# Patient Record
Sex: Female | Born: 1960
Health system: Southern US, Community
[De-identification: ages and names within clinical notes are randomized; demographics above are authoritative.]

## PROBLEM LIST (undated history)

## (undated) DIAGNOSIS — I10 Essential (primary) hypertension: Secondary | ICD-10-CM

## (undated) DIAGNOSIS — E663 Overweight: Secondary | ICD-10-CM

## (undated) DIAGNOSIS — Z0000 Encounter for general adult medical examination without abnormal findings: Secondary | ICD-10-CM

## (undated) DIAGNOSIS — E119 Type 2 diabetes mellitus without complications: Secondary | ICD-10-CM

## (undated) HISTORY — DX: Type 2 diabetes mellitus without complications: E11.9

## (undated) HISTORY — DX: Overweight: E66.3

## (undated) HISTORY — PX: COLONOSCOPY: SHX174

## (undated) HISTORY — DX: Essential (primary) hypertension: I10

## (undated) HISTORY — DX: Encounter for general adult medical examination without abnormal findings: Z00.00

## (undated) HISTORY — PX: DILATION AND CURETTAGE OF UTERUS: SHX78

---

## 2001-04-16 ENCOUNTER — Other Ambulatory Visit: Admission: RE | Admit: 2001-04-16 | Discharge: 2001-04-16 | Payer: Self-pay | Admitting: *Deleted

## 2002-04-09 ENCOUNTER — Other Ambulatory Visit: Admission: RE | Admit: 2002-04-09 | Discharge: 2002-04-09 | Payer: Self-pay | Admitting: *Deleted

## 2003-04-22 ENCOUNTER — Other Ambulatory Visit: Admission: RE | Admit: 2003-04-22 | Discharge: 2003-04-22 | Payer: Self-pay | Admitting: *Deleted

## 2004-04-25 ENCOUNTER — Other Ambulatory Visit: Admission: RE | Admit: 2004-04-25 | Discharge: 2004-04-25 | Payer: Self-pay | Admitting: *Deleted

## 2005-04-26 ENCOUNTER — Other Ambulatory Visit: Admission: RE | Admit: 2005-04-26 | Discharge: 2005-04-26 | Payer: Self-pay | Admitting: *Deleted

## 2006-05-01 ENCOUNTER — Other Ambulatory Visit: Admission: RE | Admit: 2006-05-01 | Discharge: 2006-05-01 | Payer: Self-pay | Admitting: *Deleted

## 2007-07-31 ENCOUNTER — Other Ambulatory Visit: Admission: RE | Admit: 2007-07-31 | Discharge: 2007-07-31 | Payer: Self-pay | Admitting: Gynecology

## 2010-11-14 ENCOUNTER — Ambulatory Visit (INDEPENDENT_AMBULATORY_CARE_PROVIDER_SITE_OTHER): Payer: BC Managed Care – PPO | Admitting: Family Medicine

## 2010-11-14 ENCOUNTER — Encounter: Payer: Self-pay | Admitting: Family Medicine

## 2010-11-14 DIAGNOSIS — Z0000 Encounter for general adult medical examination without abnormal findings: Secondary | ICD-10-CM

## 2010-11-14 DIAGNOSIS — Z6825 Body mass index (BMI) 25.0-25.9, adult: Secondary | ICD-10-CM

## 2010-11-14 DIAGNOSIS — E663 Overweight: Secondary | ICD-10-CM

## 2010-11-14 HISTORY — DX: Overweight: E66.3

## 2010-11-14 HISTORY — DX: Encounter for general adult medical examination without abnormal findings: Z00.00

## 2010-11-14 LAB — RENAL FUNCTION PANEL
Glucose, Bld: 83 mg/dL (ref 70–99)
Phosphorus: 2.5 mg/dL (ref 2.3–4.6)
Potassium: 4.1 mEq/L (ref 3.5–5.1)
Sodium: 139 mEq/L (ref 135–145)

## 2010-11-14 LAB — CBC WITH DIFFERENTIAL/PLATELET
Basophils Absolute: 0.1 10*3/uL (ref 0.0–0.1)
Basophils Relative: 0.8 % (ref 0.0–3.0)
Eosinophils Relative: 0.9 % (ref 0.0–5.0)
Hemoglobin: 14.8 g/dL (ref 12.0–15.0)
Lymphocytes Relative: 31.4 % (ref 12.0–46.0)
MCHC: 33 g/dL (ref 30.0–36.0)
MCV: 88.7 fl (ref 78.0–100.0)
Monocytes Absolute: 0.7 10*3/uL (ref 0.1–1.0)
Neutro Abs: 4.8 10*3/uL (ref 1.4–7.7)
RBC: 5.05 Mil/uL (ref 3.87–5.11)

## 2010-11-14 LAB — TSH: TSH: 0.46 u[IU]/mL (ref 0.35–5.50)

## 2010-11-14 LAB — LIPID PANEL
LDL Cholesterol: 103 mg/dL — ABNORMAL HIGH (ref 0–99)
VLDL: 14.8 mg/dL (ref 0.0–40.0)

## 2010-11-14 LAB — HEPATIC FUNCTION PANEL
Bilirubin, Direct: 0 mg/dL (ref 0.0–0.3)
Total Bilirubin: 0.3 mg/dL (ref 0.3–1.2)
Total Protein: 7.9 g/dL (ref 6.0–8.3)

## 2010-11-14 NOTE — Progress Notes (Signed)
Olivia Graham [id number] 18-Jun-1960 11/14/2010      Progress Note New Patient  Subjective  Chief Complaint  Chief Complaint  Patient presents with  . Establish Care    new patient    HPI  She is a 50 year old Caucasian female who is in today for new patient appointment. She denies any complaints but instead is here today for annual checkup secondary to her job requesting a physical and lab. She denies recent illness, fevers, chills, chest pain palpitations, shortness of breath, GI or GU concerns. She is now requested for a tetanus. She does not take flu shots. She reports possibly having anemia in one of her pregnancies but is not under percent sure. Did have a pneumonia once and bronchitis once during childhood but this has not been recurrent.  Past Medical History  Diagnosis Date  . Overweight (BMI 25.0-29.9) 11/14/2010  . Routine general medical examination at a health care facility 11/14/2010    History reviewed. No pertinent past surgical history.  Family History  Problem Relation Age of Onset  . Heart disease Father     History   Social History  . Marital Status: Married    Spouse Name: N/A    Number of Children: N/A  . Years of Education: N/A   Occupational History  . Not on file.   Social History Main Topics  . Smoking status: Former Smoker    Types: Cigarettes    Quit date: 03/06/2009  . Smokeless tobacco: Never Used  . Alcohol Use: No  . Drug Use: No  . Sexually Active: Yes -- Female partner(s)   Other Topics Concern  . Not on file   Social History Narrative  . No narrative on file    No current outpatient prescriptions on file prior to visit.    No Known Allergies  Review of Systems  Review of Systems  Constitutional: Negative for fever, chills and malaise/fatigue.  HENT: Negative for hearing loss, nosebleeds and congestion.   Eyes: Negative for discharge.  Respiratory: Negative for cough, sputum production, shortness of breath and wheezing.    Cardiovascular: Negative for chest pain, palpitations and leg swelling.  Gastrointestinal: Negative for heartburn, nausea, vomiting, abdominal pain, diarrhea, constipation and blood in stool.  Genitourinary: Negative for dysuria, urgency, frequency and hematuria.       G3P2 s/p 2 svd and 1 incomplete miscarriage with D&C  Musculoskeletal: Negative for myalgias, back pain and falls.  Skin: Negative for rash.       Had 1 mole removed from right shoulder blade per her request and it was benign  Neurological: Negative for dizziness, tremors, sensory change, focal weakness, loss of consciousness, weakness and headaches.  Endo/Heme/Allergies: Negative for polydipsia. Does not bruise/bleed easily.  Psychiatric/Behavioral: Negative for depression and suicidal ideas. The patient is not nervous/anxious and does not have insomnia.     Objective  BP 121/81  Pulse 70  Temp(Src) 98.1 F (36.7 C) (Oral)  Ht 5\' 1"  (1.549 m)  Wt 132 lb 12.8 oz (60.238 kg)  BMI 25.09 kg/m2  SpO2 98%  LMP 11/07/2010  Physical Exam  Physical Exam  Constitutional: She is oriented to person, place, and time and well-developed, well-nourished, and in no distress. No distress.  HENT:  Head: Normocephalic and atraumatic.  Right Ear: External ear normal.  Left Ear: External ear normal.  Nose: Nose normal.  Mouth/Throat: Oropharynx is clear and moist. No oropharyngeal exudate.  Eyes: Conjunctivae are normal. Pupils are equal, round, and reactive to light.  Right eye exhibits no discharge. Left eye exhibits no discharge. No scleral icterus.  Neck: Normal range of motion. Neck supple. No thyromegaly present.  Cardiovascular: Normal rate, regular rhythm, normal heart sounds and intact distal pulses.   No murmur heard. Pulmonary/Chest: Effort normal and breath sounds normal. No respiratory distress. She has no wheezes. She has no rales.  Abdominal: Soft. Bowel sounds are normal. She exhibits no distension and no mass. There  is no tenderness.  Musculoskeletal: Normal range of motion. She exhibits no edema and no tenderness.  Lymphadenopathy:    She has no cervical adenopathy.  Neurological: She is alert and oriented to person, place, and time. She has normal reflexes. No cranial nerve deficit. Coordination normal.  Skin: Skin is warm and dry. No rash noted. She is not diaphoretic.  Psychiatric: Mood, memory and affect normal.       Assessment & Plan  Overweight (BMI 25.0-29.9) Notes some recent weight gain, encouraged decreased po intake, lean proteins, complex carbs, increased exercise and we will check TSH  Routine general medical examination at a health care facility She is in today for labs and evaluation at the request of her employer. No concerns. She sees a gynecologist to have her paps and MGM. She refused Tdap and flu shot. Declines colonoscopy when she turns 50 yo. She is educated and encouraged at length to consider screening colonoscopy next year. Patient sent for lab and will complete work form after labs available

## 2010-11-14 NOTE — Assessment & Plan Note (Signed)
Notes some recent weight gain, encouraged decreased po intake, lean proteins, complex carbs, increased exercise and we will check TSH

## 2010-11-14 NOTE — Patient Instructions (Signed)

## 2010-11-14 NOTE — Assessment & Plan Note (Addendum)
She is in today for labs and evaluation at the request of her employer. No concerns. She sees a gynecologist to have her paps and MGM. She refused Tdap and flu shot. Declines colonoscopy when she turns 50 yo. She is educated and encouraged at length to consider screening colonoscopy next year. Patient sent for lab and will complete work form after labs available

## 2011-07-06 ENCOUNTER — Other Ambulatory Visit: Payer: Self-pay | Admitting: Obstetrics and Gynecology

## 2011-07-06 ENCOUNTER — Other Ambulatory Visit: Payer: Self-pay | Admitting: Gynecology

## 2011-07-06 DIAGNOSIS — Z1231 Encounter for screening mammogram for malignant neoplasm of breast: Secondary | ICD-10-CM

## 2011-07-10 ENCOUNTER — Ambulatory Visit: Payer: BC Managed Care – PPO

## 2011-07-11 ENCOUNTER — Ambulatory Visit
Admission: RE | Admit: 2011-07-11 | Discharge: 2011-07-11 | Disposition: A | Discharge status: Home / Self Care | Payer: BC Managed Care – PPO | Source: Ambulatory Visit | Attending: Obstetrics and Gynecology | Admitting: Obstetrics and Gynecology

## 2011-07-11 DIAGNOSIS — Z1231 Encounter for screening mammogram for malignant neoplasm of breast: Secondary | ICD-10-CM

## 2011-07-13 ENCOUNTER — Other Ambulatory Visit: Payer: Self-pay | Admitting: Obstetrics and Gynecology

## 2011-07-13 DIAGNOSIS — R928 Other abnormal and inconclusive findings on diagnostic imaging of breast: Secondary | ICD-10-CM

## 2011-07-18 ENCOUNTER — Ambulatory Visit
Admission: RE | Admit: 2011-07-18 | Discharge: 2011-07-18 | Disposition: A | Discharge status: Home / Self Care | Payer: BC Managed Care – PPO | Source: Ambulatory Visit | Attending: Obstetrics and Gynecology | Admitting: Obstetrics and Gynecology

## 2011-07-18 DIAGNOSIS — R928 Other abnormal and inconclusive findings on diagnostic imaging of breast: Secondary | ICD-10-CM

## 2011-07-19 ENCOUNTER — Ambulatory Visit: Payer: BC Managed Care – PPO

## 2012-09-04 ENCOUNTER — Other Ambulatory Visit: Payer: Self-pay

## 2012-09-04 DIAGNOSIS — Z1231 Encounter for screening mammogram for malignant neoplasm of breast: Secondary | ICD-10-CM

## 2012-10-25 ENCOUNTER — Ambulatory Visit
Admission: RE | Admit: 2012-10-25 | Discharge: 2012-10-25 | Disposition: A | Discharge status: Home / Self Care | Payer: BC Managed Care – PPO | Source: Ambulatory Visit

## 2012-10-25 DIAGNOSIS — Z1231 Encounter for screening mammogram for malignant neoplasm of breast: Secondary | ICD-10-CM

## 2013-07-16 ENCOUNTER — Encounter: Payer: Self-pay | Admitting: Podiatry

## 2013-07-16 ENCOUNTER — Ambulatory Visit (INDEPENDENT_AMBULATORY_CARE_PROVIDER_SITE_OTHER): Payer: BC Managed Care – PPO | Admitting: Podiatry

## 2013-07-16 VITALS — BP 158/85 | HR 82 | Resp 16

## 2013-07-16 DIAGNOSIS — L600 Ingrowing nail: Secondary | ICD-10-CM

## 2013-07-16 DIAGNOSIS — M7660 Achilles tendinitis, unspecified leg: Secondary | ICD-10-CM

## 2013-07-16 NOTE — Progress Notes (Signed)
   Subjective:    Patient ID: Olivia Graham, female    DOB: 06-Aug-1960, 53 y.o.   MRN: [id number]  HPI  Big toes discolored/ dark bilaterally, denies pain, knot on back of heel on left foot. " My toe nails were removed in 2013 and have looked right since they grew back"  Review of Systems  All other systems reviewed and are negative.      Objective:   Physical Exam        Assessment & Plan:

## 2013-07-16 NOTE — Patient Instructions (Signed)

## 2013-07-16 NOTE — Progress Notes (Signed)
Subjective:     Patient ID: Olivia Graham, female   DOB: May 19, 1960, 53 y.o.   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 Patient wants this procedure and understands risk and today I infiltrated each hallux 60 mg Xylocaine Marcaine mixture remove the hallux nails exposed matrix and applied phenol 5 applications 30 seconds followed by alcohol lavaged and sterile dressing. Gave instructions on soaks

## 2013-07-23 ENCOUNTER — Ambulatory Visit: Payer: Self-pay | Admitting: Podiatry

## 2013-07-23 ENCOUNTER — Ambulatory Visit: Payer: Self-pay | Admitting: Podiatrist

## 2013-10-08 ENCOUNTER — Other Ambulatory Visit: Payer: Self-pay

## 2013-10-08 DIAGNOSIS — Z1231 Encounter for screening mammogram for malignant neoplasm of breast: Secondary | ICD-10-CM

## 2013-10-31 ENCOUNTER — Ambulatory Visit
Admission: RE | Admit: 2013-10-31 | Discharge: 2013-10-31 | Disposition: A | Discharge status: Home / Self Care | Payer: BC Managed Care – PPO | Source: Ambulatory Visit

## 2013-10-31 DIAGNOSIS — Z1231 Encounter for screening mammogram for malignant neoplasm of breast: Secondary | ICD-10-CM

## 2013-11-12 ENCOUNTER — Ambulatory Visit (INDEPENDENT_AMBULATORY_CARE_PROVIDER_SITE_OTHER): Payer: BC Managed Care – PPO | Admitting: Nurse Practitioner

## 2013-11-12 ENCOUNTER — Encounter: Payer: Self-pay | Admitting: Nurse Practitioner

## 2013-11-12 VITALS — BP 132/73 | HR 70 | Temp 98.0°F | Resp 18 | Ht 61.0 in | Wt 139.0 lb

## 2013-11-12 DIAGNOSIS — W57XXXA Bitten or stung by nonvenomous insect and other nonvenomous arthropods, initial encounter: Secondary | ICD-10-CM

## 2013-11-12 DIAGNOSIS — T148 Other injury of unspecified body region: Secondary | ICD-10-CM

## 2013-11-12 NOTE — Patient Instructions (Addendum)
Keep areas clean w/mild soap & water wash daily.  Apply benadryl cream as needed for itching Ice will decrease itch also. Apply neosporin daily. Monitor for increased redness or dark areas. If this occurs, let us know. Also call us if you develop abdominal pain, fever, Headache that is not relieved by over-the-counter medications.  Nice to see you!

## 2013-11-12 NOTE — Progress Notes (Signed)
Pre visit review using our clinic review tool, if applicable. No additional management support is needed unless otherwise documented below in the visit note. 

## 2013-11-13 DIAGNOSIS — W57XXXA Bitten or stung by nonvenomous insect and other nonvenomous arthropods, initial encounter: Secondary | ICD-10-CM | POA: Insufficient documentation

## 2013-11-13 NOTE — Progress Notes (Signed)
   Subjective:    Patient ID: Olivia Graham, female    DOB: [birth year], 53 y.o.   MRN: [id number]  HPI Comments: Pt was stung by bee on L forearm yesterday. She also has 3 lesions on R arm & a few on abdomen. She does not know what these came from.  Rash This is a new problem. The current episode started yesterday. The problem has been gradually improving since onset. The affected locations include the abdomen, left arm and right arm. The rash is characterized by blistering, redness and itchiness. She was exposed to an insect bite/sting. Pertinent negatives include no cough, fever, shortness of breath or vomiting. (No SOB or abdominal pain) Past treatments include anti-itch cream. The treatment provided mild relief.      Review of Systems  Constitutional: Negative for fever.  Respiratory: Negative for cough and shortness of breath.   Gastrointestinal: Negative for nausea, vomiting and abdominal pain.  Skin: Positive for rash.  Neurological: Positive for headaches.       Objective:   Physical Exam  Vitals reviewed. Constitutional: She is oriented to person, place, and time. She appears well-developed and well-nourished.  HENT:  Head: Normocephalic and atraumatic.  Eyes: Conjunctivae are normal. Right eye exhibits no discharge. Left eye exhibits no discharge.  Cardiovascular: Normal rate, regular rhythm and normal heart sounds.   Pulmonary/Chest: Effort normal. No respiratory distress. She has no wheezes. She has no rales.  Neurological: She is alert and oriented to person, place, and time.  Skin: Skin is warm and dry. Rash noted.     Psychiatric: She has a normal mood and affect. Her behavior is normal. Thought content normal.          Assessment & Plan:  1. Insect bite Bee sting on L arm Another rash on R arm-looks like mosquito Rash of abdpomen looks different, tiny vesicles. Benadryl cream or ice for itch. Monitor. If develops abd pain, SOB, fever, or lesions turn  dark/black, return.

## 2014-09-29 ENCOUNTER — Other Ambulatory Visit: Payer: Self-pay

## 2014-09-29 DIAGNOSIS — Z1231 Encounter for screening mammogram for malignant neoplasm of breast: Secondary | ICD-10-CM

## 2014-11-04 ENCOUNTER — Ambulatory Visit
Admission: RE | Admit: 2014-11-04 | Discharge: 2014-11-04 | Disposition: A | Discharge status: Home / Self Care | Payer: BLUE CROSS/BLUE SHIELD | Source: Ambulatory Visit

## 2014-11-04 DIAGNOSIS — Z1231 Encounter for screening mammogram for malignant neoplasm of breast: Secondary | ICD-10-CM

## 2015-09-27 ENCOUNTER — Other Ambulatory Visit: Payer: Self-pay | Admitting: Obstetrics and Gynecology

## 2015-09-27 DIAGNOSIS — Z1231 Encounter for screening mammogram for malignant neoplasm of breast: Secondary | ICD-10-CM

## 2015-11-09 ENCOUNTER — Ambulatory Visit
Admission: RE | Admit: 2015-11-09 | Discharge: 2015-11-09 | Disposition: A | Discharge status: Home / Self Care | Payer: BLUE CROSS/BLUE SHIELD | Source: Ambulatory Visit | Attending: Obstetrics and Gynecology | Admitting: Obstetrics and Gynecology

## 2015-11-09 DIAGNOSIS — Z1231 Encounter for screening mammogram for malignant neoplasm of breast: Secondary | ICD-10-CM | POA: Diagnosis not present

## 2015-12-17 DIAGNOSIS — R350 Frequency of micturition: Secondary | ICD-10-CM | POA: Diagnosis not present

## 2015-12-17 DIAGNOSIS — Z1389 Encounter for screening for other disorder: Secondary | ICD-10-CM | POA: Diagnosis not present

## 2015-12-17 DIAGNOSIS — Z130 Encounter for screening for diseases of the blood and blood-forming organs and certain disorders involving the immune mechanism: Secondary | ICD-10-CM | POA: Diagnosis not present

## 2015-12-17 DIAGNOSIS — Z01419 Encounter for gynecological examination (general) (routine) without abnormal findings: Secondary | ICD-10-CM | POA: Diagnosis not present

## 2015-12-17 DIAGNOSIS — Z6827 Body mass index (BMI) 27.0-27.9, adult: Secondary | ICD-10-CM | POA: Diagnosis not present

## 2015-12-20 ENCOUNTER — Encounter: Payer: Self-pay | Admitting: Podiatry

## 2015-12-20 ENCOUNTER — Ambulatory Visit (INDEPENDENT_AMBULATORY_CARE_PROVIDER_SITE_OTHER): Payer: BLUE CROSS/BLUE SHIELD | Admitting: Podiatry

## 2015-12-20 VITALS — BP 142/89 | HR 75 | Resp 16

## 2015-12-20 DIAGNOSIS — L600 Ingrowing nail: Secondary | ICD-10-CM | POA: Diagnosis not present

## 2015-12-20 DIAGNOSIS — M79676 Pain in unspecified toe(s): Secondary | ICD-10-CM

## 2015-12-20 DIAGNOSIS — L03039 Cellulitis of unspecified toe: Secondary | ICD-10-CM

## 2015-12-20 NOTE — Progress Notes (Signed)
Subjective: Patient presents today for follow-up evaluation of a recurrent toenail ligament back again. Patient states she had nail trauma a few years back at which time the nails were removed. When the nails grew back. Severely dystrophic at which time total permanent nail avulsion was performed. Patient states that she's had regrowth of the left great toenail after the procedure.   Objective:  General: Well developed, nourished, in no acute distress, alert and oriented x3   Dermatology: Skin is warm, dry and supple bilateral. Left great toenail appears to be dystrophic with abnormal nail growth. Pain on palpation noted to the border of the nail fold. The remaining nails appear unremarkable at this time. There are no open sores, lesions.  Vascular: Dorsalis Pedis artery and Posterior Tibial artery pedal pulses palpable. No lower extremity edema noted.   Neruologic: Grossly intact via light touch bilateral.  Musculoskeletal: Muscular strength within normal limits in all groups bilateral. Normal range of motion noted to all pedal and ankle joints.   Assesement: #1 left great toenail regrowth after permanent total nail avulsion procedure #2 pain in left great toenail   Plan of Care:  1. Patient evaluated.  2. Discussed treatment alternatives and plan of care. Explained nail avulsion procedure and post procedure course to patient. 3. Patient opted for permanent total nail avulsion.  4. Prior to procedure, local anesthesia infiltration utilized using 3 ml of a 50:50 mixture of 2% plain lidocaine and 0.5% plain marcaine in a normal hallux block fashion and a betadine prep performed.  5. Partial permanent nail avulsion with chemical matrixectomy performed using 3x30sec applications of phenol followed by alcohol flush.  6. Light dressing applied. 7. Return to clinic in 2 weeks.   Dr. Felecia ShellingBrent M. Vail [facility]uncannon [facility]

## 2015-12-20 NOTE — Patient Instructions (Signed)

## 2016-01-03 ENCOUNTER — Ambulatory Visit (INDEPENDENT_AMBULATORY_CARE_PROVIDER_SITE_OTHER): Payer: BLUE CROSS/BLUE SHIELD | Admitting: Podiatry

## 2016-01-03 DIAGNOSIS — M79676 Pain in unspecified toe(s): Secondary | ICD-10-CM

## 2016-01-03 DIAGNOSIS — S91109D Unspecified open wound of unspecified toe(s) without damage to nail, subsequent encounter: Secondary | ICD-10-CM

## 2016-01-03 DIAGNOSIS — L03039 Cellulitis of unspecified toe: Secondary | ICD-10-CM

## 2016-01-03 NOTE — Progress Notes (Signed)

## 2016-04-04 DIAGNOSIS — J069 Acute upper respiratory infection, unspecified: Secondary | ICD-10-CM | POA: Diagnosis not present

## 2016-04-06 DIAGNOSIS — B349 Viral infection, unspecified: Secondary | ICD-10-CM | POA: Diagnosis not present

## 2016-04-06 DIAGNOSIS — R6889 Other general symptoms and signs: Secondary | ICD-10-CM | POA: Diagnosis not present

## 2016-07-07 DIAGNOSIS — L821 Other seborrheic keratosis: Secondary | ICD-10-CM | POA: Diagnosis not present

## 2016-07-07 DIAGNOSIS — B078 Other viral warts: Secondary | ICD-10-CM | POA: Diagnosis not present

## 2016-07-07 DIAGNOSIS — L309 Dermatitis, unspecified: Secondary | ICD-10-CM | POA: Diagnosis not present

## 2016-08-08 DIAGNOSIS — R030 Elevated blood-pressure reading, without diagnosis of hypertension: Secondary | ICD-10-CM | POA: Diagnosis not present

## 2016-08-15 DIAGNOSIS — R42 Dizziness and giddiness: Secondary | ICD-10-CM | POA: Diagnosis not present

## 2016-09-08 DIAGNOSIS — R42 Dizziness and giddiness: Secondary | ICD-10-CM | POA: Diagnosis not present

## 2016-10-13 DIAGNOSIS — E785 Hyperlipidemia, unspecified: Secondary | ICD-10-CM | POA: Diagnosis not present

## 2016-10-13 DIAGNOSIS — R5383 Other fatigue: Secondary | ICD-10-CM | POA: Diagnosis not present

## 2017-01-02 ENCOUNTER — Ambulatory Visit: Payer: BLUE CROSS/BLUE SHIELD

## 2017-01-02 ENCOUNTER — Other Ambulatory Visit: Payer: Self-pay | Admitting: Obstetrics and Gynecology

## 2017-01-02 DIAGNOSIS — Z1231 Encounter for screening mammogram for malignant neoplasm of breast: Secondary | ICD-10-CM

## 2017-01-10 ENCOUNTER — Ambulatory Visit
Admission: RE | Admit: 2017-01-10 | Discharge: 2017-01-10 | Disposition: A | Discharge status: Home / Self Care | Payer: BLUE CROSS/BLUE SHIELD | Source: Ambulatory Visit | Attending: Obstetrics and Gynecology | Admitting: Obstetrics and Gynecology

## 2017-01-10 DIAGNOSIS — Z01419 Encounter for gynecological examination (general) (routine) without abnormal findings: Secondary | ICD-10-CM | POA: Diagnosis not present

## 2017-01-10 DIAGNOSIS — Z1231 Encounter for screening mammogram for malignant neoplasm of breast: Secondary | ICD-10-CM | POA: Diagnosis not present

## 2017-01-10 DIAGNOSIS — Z6827 Body mass index (BMI) 27.0-27.9, adult: Secondary | ICD-10-CM | POA: Diagnosis not present

## 2017-01-10 DIAGNOSIS — Z130 Encounter for screening for diseases of the blood and blood-forming organs and certain disorders involving the immune mechanism: Secondary | ICD-10-CM | POA: Diagnosis not present

## 2017-01-10 DIAGNOSIS — Z1389 Encounter for screening for other disorder: Secondary | ICD-10-CM | POA: Diagnosis not present

## 2017-01-10 DIAGNOSIS — H10221 Pseudomembranous conjunctivitis, right eye: Secondary | ICD-10-CM | POA: Diagnosis not present

## 2017-01-10 DIAGNOSIS — R82998 Other abnormal findings in urine: Secondary | ICD-10-CM | POA: Diagnosis not present

## 2017-01-11 DIAGNOSIS — R82998 Other abnormal findings in urine: Secondary | ICD-10-CM | POA: Diagnosis not present

## 2017-01-24 ENCOUNTER — Ambulatory Visit: Payer: Self-pay

## 2017-10-01 ENCOUNTER — Other Ambulatory Visit: Payer: Self-pay | Admitting: Obstetrics and Gynecology

## 2017-10-01 DIAGNOSIS — Z1231 Encounter for screening mammogram for malignant neoplasm of breast: Secondary | ICD-10-CM

## 2018-01-14 ENCOUNTER — Ambulatory Visit
Admission: RE | Admit: 2018-01-14 | Discharge: 2018-01-14 | Disposition: A | Discharge status: Home / Self Care | Payer: BLUE CROSS/BLUE SHIELD | Source: Ambulatory Visit | Attending: Obstetrics and Gynecology | Admitting: Obstetrics and Gynecology

## 2018-01-14 DIAGNOSIS — Z1231 Encounter for screening mammogram for malignant neoplasm of breast: Secondary | ICD-10-CM | POA: Diagnosis not present

## 2018-01-14 DIAGNOSIS — Z130 Encounter for screening for diseases of the blood and blood-forming organs and certain disorders involving the immune mechanism: Secondary | ICD-10-CM | POA: Diagnosis not present

## 2018-01-14 DIAGNOSIS — Z01419 Encounter for gynecological examination (general) (routine) without abnormal findings: Secondary | ICD-10-CM | POA: Diagnosis not present

## 2018-01-14 DIAGNOSIS — Z6827 Body mass index (BMI) 27.0-27.9, adult: Secondary | ICD-10-CM | POA: Diagnosis not present

## 2018-04-03 DIAGNOSIS — E785 Hyperlipidemia, unspecified: Secondary | ICD-10-CM | POA: Diagnosis not present

## 2018-04-03 DIAGNOSIS — Z0000 Encounter for general adult medical examination without abnormal findings: Secondary | ICD-10-CM | POA: Diagnosis not present

## 2018-05-13 ENCOUNTER — Other Ambulatory Visit: Payer: Self-pay | Admitting: Family Medicine

## 2018-05-13 ENCOUNTER — Ambulatory Visit
Admission: RE | Admit: 2018-05-13 | Discharge: 2018-05-13 | Disposition: A | Discharge status: Home / Self Care | Payer: Worker's Compensation | Source: Ambulatory Visit | Attending: Family Medicine | Admitting: Family Medicine

## 2018-05-13 ENCOUNTER — Ambulatory Visit
Admission: RE | Admit: 2018-05-13 | Discharge: 2018-05-13 | Disposition: A | Discharge status: Home / Self Care | Payer: Worker's Compensation | Attending: Family Medicine | Admitting: Family Medicine

## 2018-05-13 DIAGNOSIS — R52 Pain, unspecified: Secondary | ICD-10-CM

## 2018-05-13 DIAGNOSIS — S6992XA Unspecified injury of left wrist, hand and finger(s), initial encounter: Secondary | ICD-10-CM | POA: Diagnosis present

## 2018-10-23 ENCOUNTER — Other Ambulatory Visit: Payer: Self-pay | Admitting: Obstetrics and Gynecology

## 2018-10-23 DIAGNOSIS — Z1231 Encounter for screening mammogram for malignant neoplasm of breast: Secondary | ICD-10-CM

## 2018-11-20 DIAGNOSIS — R51 Headache: Secondary | ICD-10-CM | POA: Diagnosis not present

## 2018-12-10 DIAGNOSIS — R519 Headache, unspecified: Secondary | ICD-10-CM | POA: Diagnosis not present

## 2018-12-10 DIAGNOSIS — G8929 Other chronic pain: Secondary | ICD-10-CM | POA: Diagnosis not present

## 2018-12-10 DIAGNOSIS — G5602 Carpal tunnel syndrome, left upper limb: Secondary | ICD-10-CM | POA: Diagnosis not present

## 2018-12-25 ENCOUNTER — Ambulatory Visit
Admission: RE | Admit: 2018-12-25 | Discharge: 2018-12-25 | Disposition: A | Discharge status: Home / Self Care | Payer: BLUE CROSS/BLUE SHIELD | Source: Ambulatory Visit | Attending: Family Medicine | Admitting: Family Medicine

## 2018-12-25 ENCOUNTER — Other Ambulatory Visit: Payer: Self-pay | Admitting: Family Medicine

## 2018-12-25 DIAGNOSIS — R519 Headache, unspecified: Secondary | ICD-10-CM

## 2018-12-31 DIAGNOSIS — M79642 Pain in left hand: Secondary | ICD-10-CM | POA: Diagnosis not present

## 2018-12-31 DIAGNOSIS — G44309 Post-traumatic headache, unspecified, not intractable: Secondary | ICD-10-CM | POA: Diagnosis not present

## 2019-01-17 ENCOUNTER — Ambulatory Visit: Payer: BLUE CROSS/BLUE SHIELD

## 2019-01-20 ENCOUNTER — Other Ambulatory Visit: Payer: Self-pay

## 2019-01-20 ENCOUNTER — Ambulatory Visit
Admission: RE | Admit: 2019-01-20 | Discharge: 2019-01-20 | Disposition: A | Discharge status: Home / Self Care | Payer: BLUE CROSS/BLUE SHIELD | Source: Ambulatory Visit | Attending: Obstetrics and Gynecology | Admitting: Obstetrics and Gynecology

## 2019-01-20 DIAGNOSIS — Z1389 Encounter for screening for other disorder: Secondary | ICD-10-CM | POA: Diagnosis not present

## 2019-01-20 DIAGNOSIS — Z1231 Encounter for screening mammogram for malignant neoplasm of breast: Secondary | ICD-10-CM

## 2019-01-20 DIAGNOSIS — L918 Other hypertrophic disorders of the skin: Secondary | ICD-10-CM | POA: Diagnosis not present

## 2019-01-20 DIAGNOSIS — Z01419 Encounter for gynecological examination (general) (routine) without abnormal findings: Secondary | ICD-10-CM | POA: Diagnosis not present

## 2019-01-20 DIAGNOSIS — Z130 Encounter for screening for diseases of the blood and blood-forming organs and certain disorders involving the immune mechanism: Secondary | ICD-10-CM | POA: Diagnosis not present

## 2019-01-20 DIAGNOSIS — Z6827 Body mass index (BMI) 27.0-27.9, adult: Secondary | ICD-10-CM | POA: Diagnosis not present

## 2019-02-06 DIAGNOSIS — G5602 Carpal tunnel syndrome, left upper limb: Secondary | ICD-10-CM | POA: Diagnosis not present

## 2019-02-06 DIAGNOSIS — M65312 Trigger thumb, left thumb: Secondary | ICD-10-CM | POA: Diagnosis not present

## 2019-02-07 DIAGNOSIS — R030 Elevated blood-pressure reading, without diagnosis of hypertension: Secondary | ICD-10-CM | POA: Diagnosis not present

## 2019-02-07 DIAGNOSIS — F439 Reaction to severe stress, unspecified: Secondary | ICD-10-CM | POA: Diagnosis not present

## 2019-02-17 DIAGNOSIS — G5602 Carpal tunnel syndrome, left upper limb: Secondary | ICD-10-CM | POA: Diagnosis not present

## 2019-02-25 DIAGNOSIS — M65312 Trigger thumb, left thumb: Secondary | ICD-10-CM | POA: Diagnosis not present

## 2019-02-25 DIAGNOSIS — G5602 Carpal tunnel syndrome, left upper limb: Secondary | ICD-10-CM | POA: Diagnosis not present

## 2020-01-19 ENCOUNTER — Other Ambulatory Visit: Payer: Self-pay | Admitting: Obstetrics and Gynecology

## 2020-01-19 DIAGNOSIS — Z1231 Encounter for screening mammogram for malignant neoplasm of breast: Secondary | ICD-10-CM

## 2020-03-18 ENCOUNTER — Ambulatory Visit: Payer: Self-pay | Admitting: *Deleted

## 2020-03-18 ENCOUNTER — Other Ambulatory Visit: Payer: Self-pay

## 2020-03-18 VITALS — BP 148/90 | Wt 153.6 lb

## 2020-03-18 DIAGNOSIS — Z01419 Encounter for gynecological examination (general) (routine) without abnormal findings: Secondary | ICD-10-CM

## 2020-03-18 NOTE — Progress Notes (Signed)
Olivia Graham is a 60 y.o. G3P0 female who presents to [facility] clinic today with no complaints.    Pap Smear: Pap smear completed today. Last Pap smear was 11/04/2014 at Olivia Graham office and was normal. Per patient has history of an abnormal Pap smear 10 years ago that a repeat Pap smear was completed for follow-up. Patient stated she has had at least three normal Pap smears since repeat Pap smear. Last Pap smear result is available in Epic.   Physical exam: Breasts Breasts symmetrical. No skin abnormalities bilateral breasts. No nipple retraction bilateral breasts. No nipple discharge bilateral breasts. No lymphadenopathy. No lumps palpated bilateral breasts. No complaints of pain or tenderness on exam.       Pelvic/Bimanual Ext Genitalia No lesions, no swelling and no discharge observed on external genitalia.        Vagina Vagina pink and normal texture. No lesions or discharge observed in vagina.        Cervix Cervix is present. Cervix pink and of normal texture. Cervix friable. No discharge observed.    Uterus Uterus is present and palpable. Uterus in normal position and normal size.        Adnexae Bilateral ovaries present and palpable. No tenderness on palpation.         Rectovaginal No rectal exam completed today since patient had no rectal complaints. No skin abnormalities observed on exam.     Smoking History: Patient is a former smoker that quit in 2011.   Patient Navigation: Patient education provided. Access to services provided for patient through [facility] program.   Colorectal Cancer Screening: Per patient had a colonoscopy completed in 2019. No complaints today.    Breast and Cervical Cancer Risk Assessment: Patient does not have family history of breast cancer, known genetic mutations, or radiation treatment to the chest before age 36. Patient does not have history of cervical dysplasia, immunocompromised, or DES exposure in-utero.  Risk Assessment    Risk  Scores      03/18/2020   Last edited by: Meryl Dare, CMA   5-year risk: 1.1 %   Lifetime risk: 6.3 %          A: [facility] exam with pap smear No complaints.  P: Referred patient to the [facility] for a screening mammogram on mobile unit. Appointment scheduled Thursday, March 25, 2020 at 0940.  Olivia Heidelberg, RN 03/18/2020 1:12 PM

## 2020-03-18 NOTE — Patient Instructions (Signed)
Explained breast self awareness with Olivia Graham. Pap smear completed today. Let her know [facility]CP will cover Pap smears and HPV typing every 5 years unless has a history of abnormal Pap smears. Referred patient to the [facility] for a screening mammogram on mobile unit. Appointment scheduled Thursday, March 25, 2020 at 0940. Patient aware of appointment and will be there. Let patient know the Breast Center will follow up with her within a couple weeks after mammogram  with results by letter or phone. [name] verbalized understanding.  Sheelah Ritacco, Kathaleen Maser, RN 1:12 PM

## 2020-03-19 ENCOUNTER — Telehealth: Payer: Self-pay

## 2020-03-19 LAB — CYTOLOGY - PAP
Comment: NEGATIVE
Diagnosis: NEGATIVE
High risk HPV: NEGATIVE

## 2020-03-19 NOTE — Telephone Encounter (Signed)
Patient informed negative Pap/HPV results, next pap smear due in 3 years. Patient verbalized understanding.

## 2020-03-25 ENCOUNTER — Telehealth: Payer: Self-pay | Admitting: *Deleted

## 2020-03-25 ENCOUNTER — Other Ambulatory Visit: Payer: Self-pay

## 2020-03-25 ENCOUNTER — Ambulatory Visit
Admission: RE | Admit: 2020-03-25 | Discharge: 2020-03-25 | Disposition: A | Discharge status: Home / Self Care | Payer: No Typology Code available for payment source | Source: Ambulatory Visit | Attending: Obstetrics and Gynecology | Admitting: Obstetrics and Gynecology

## 2020-03-25 DIAGNOSIS — Z1231 Encounter for screening mammogram for malignant neoplasm of breast: Secondary | ICD-10-CM

## 2020-03-25 NOTE — Telephone Encounter (Signed)
Pt left VM message stating that she has first appt in office on 1/26. She woke up this morning with pain under her Lt breast (rib area). She further stated that she had a fall one week ago. She is not sure if she can be seen sooner or who to call if she should be seen elsewhere.

## 2020-03-31 ENCOUNTER — Other Ambulatory Visit: Payer: Self-pay

## 2020-03-31 ENCOUNTER — Inpatient Hospital Stay: Payer: Self-pay | Attending: Obstetrics and Gynecology | Admitting: *Deleted

## 2020-03-31 VITALS — BP 132/84 | Ht 61.0 in | Wt 149.8 lb

## 2020-03-31 DIAGNOSIS — Z0000 Encounter for general adult medical examination without abnormal findings: Secondary | ICD-10-CM

## 2020-03-31 NOTE — Progress Notes (Signed)
[facility] initial screening     Clinical Measurement:  Vitals:   03/31/20 1013 03/31/20 1333  BP: 118/82 132/84   Fasting Labs Drawn Today, will review with patient when they result.   Medical History:  Patient states that she does not have high cholesterol, has high blood pressure and she does not have diabetes.  Medications:  Patient states that she does not take medication to lower cholesterol, blood pressure or blood sugar.  Patient does not take an aspirin a day to help prevent a heart attack or stroke.    Blood pressure, self measurement: Patient states that she does not measure blood pressure from home. She checks her blood pressure N/A. She shares her readings with a health care provider: N/A.   Nutrition: Patient states that on average she eats 1 cups of fruit and 1 cups of vegetables per day. Patient states that she does not eat fish at least 2 times per week. Patient eats less than half servings of whole grains. Patient drinks less than 36 ounces of beverages with added sugar weekly: yes. Patient is currently watching sodium or salt intake: yes. In the past 7 days patient has had 0 drinks containing alcohol. On average patient drinks 0 drinks containing alcohol per day.      Physical activity:  Patient states that she gets 105 minutes of moderate and 0 minutes of vigorous physical activity each week.  Smoking status:  Patient states that she has is a former smoker, quit 12+ months ago .   Quality of life:  Over the past 2 weeks patient states that she had little interest or pleasure in doing things: not at all. She has been feeling down, depressed or hopeless:not at all.    Risk reduction and counseling:   Spoke with patient about adding more fruits and vegetables in daily diet. Explained that the recommendation is for 2 cups of fruit and 3 cups of vegetables per day. Spoke with patient about the importance of whole grains and fish in a heart healthy diet. Patient currently  consumes whole wheat bread. Gave suggestions for other whole grains that can be incorporated in diet such as whole wheat pasta, whole grain cereals, oatmeal and brown rice. Patient currently does not consume fish often (maybe once a month). Offered suggestions for salmon, tuna, mackerel, sea bass or sardines. Encouraged patient to continue watching salt intake due to hx of elevated BP. Patient stated that her BP can be high at times when checked. 1st BP reading today was normal with a slightly higher BP taken later during visit. Patient usually walks about 15 minutes daily.    Navigation:  I will notify patient of lab results.  Patient is aware of 2 more health coaching sessions and a follow up.  Time: 25 minutes

## 2020-04-01 LAB — GLUCOSE, RANDOM: Glucose: 116 mg/dL — ABNORMAL HIGH (ref 65–99)

## 2020-04-01 LAB — LIPID PANEL
Chol/HDL Ratio: 4 ratio (ref 0.0–4.4)
Cholesterol, Total: 136 mg/dL (ref 100–199)
HDL: 34 mg/dL — ABNORMAL LOW (ref >39)
LDL Chol Calc (NIH): 86 mg/dL (ref 0–99)
Triglycerides: 80 mg/dL (ref 0–149)
VLDL Cholesterol Cal: 16 mg/dL (ref 5–40)

## 2020-04-01 LAB — HEMOGLOBIN A1C
Est. average glucose Bld gHb Est-mCnc: 154 mg/dL
Hgb A1c MFr Bld: 7 % — ABNORMAL HIGH (ref 4.8–5.6)

## 2020-04-02 NOTE — Telephone Encounter (Signed)
Pt was seen by Fonnie Mu on 03/31/20 for well adult health check.  Encounter closed

## 2020-04-05 ENCOUNTER — Telehealth: Payer: Self-pay | Admitting: *Deleted

## 2020-04-05 ENCOUNTER — Telehealth: Payer: Self-pay

## 2020-04-05 NOTE — Telephone Encounter (Signed)
Pt left VM message stating that she had some tests and was told she would be called with results. She also has an appointment on 2/10 but is not sure what is going on. Pt requests a call back.

## 2020-04-05 NOTE — Telephone Encounter (Signed)
Health coaching 2   interpreter-   Labs-cholesterol , LDL cholesterol , triglycerides , HDL cholesterol , hemoglobin A1C , mean plasma glucose   Patient understands and is aware of her lab results.   Goals-     Navigation:  Patient is aware of 1 more health coaching sessions and a follow up.   Time-                             Health coaching 2    Labs- 136 cholesterol, 86 LDL cholesterol, 80 triglycerides, 34 HDL cholesterol, 7.0 hemoglobin A1C, 116 mean plasma glucose. Patient understands and is aware of her lab results.   Goals-  Spoke in detail with patient about lab results and answered any questions that patient had regarding results.  1. Increase heart healthy fats in diet to raise HDL. (Olive oil, avocados, nuts) (Heart Healthy Fish) (Whole grains). 2. Reduce the amount of sweets and sugars consumed in both food and drink. 3. Reduce the amount of carbs consumed. Explained to patient what 1 ounce of different grains/carbs would look like and the recommendation for 6 oz or less per day. 4. Continue with daily walks (with goal of 20 minutes per day.    Navigation:  Patient is aware of 1 more health coaching sessions and a follow up. Patient is scheduled for follow-up appointment with Internal Medicine on Thursday, April 15, 2020 @ 1:15 pm.  Time- 16 minutes

## 2020-04-06 ENCOUNTER — Telehealth: Payer: Self-pay | Admitting: General Practice

## 2020-04-06 NOTE — Telephone Encounter (Signed)
Patient called and left message on nurse voicemail line stating she has been feeling bad since last night. Reports episode of vomiting and stomach pain. Patient is requesting a call back. Called patient and she states she wasn't sure if pain or vomiting episode was related to her blood sugars/diabetes. Told patient likely not and advised setting up appt with PCP. Provided information for [facility]. Patient verbalized understanding.

## 2020-04-14 NOTE — Progress Notes (Signed)
   CC: Diabetes  HPI:  Olivia Graham is a 60 y.o. female with history as below presenting for diabetes and to establish care. Please refer to problem based charting for further details of assessment and plan of current problem and chronic medical conditions.   Past Medical History:  Diagnosis Date  . Diabetes (HCC)   . Hypertension   . Overweight (BMI 25.0-29.9) 11/14/2010  . Routine general medical examination at a health care facility 11/14/2010   No current outpatient medications  Past Surgical History:  Procedure Laterality Date  . COLONOSCOPY    . DILATION AND CURETTAGE OF UTERUS      Family History  Problem Relation Age of Onset  . Heart disease Father   . Breast cancer Neg Hx     Social History   Tobacco Use  . Smoking status: Former Smoker    Types: Cigarettes    Quit date: 03/06/2009    Years since quitting: 11.1  . Smokeless tobacco: Never Used  Vaping Use  . Vaping Use: Never used  Substance Use Topics  . Alcohol use: No  . Drug use: No  Works at [employer]. Lives at home with her husband.   Review of Systems:   Review of Systems  Cardiovascular: Negative for chest pain and palpitations.  Gastrointestinal: Positive for abdominal pain.  Genitourinary: Negative for dysuria and urgency.  Neurological: Positive for dizziness. Negative for loss of consciousness.  All other systems reviewed and are negative.    Physical Exam: Vitals:   04/15/20 1319  BP: 121/83  Pulse: 77  Temp: 97.9 F (36.6 C)  SpO2: 97%  Weight: 147 lb 9.6 oz (67 kg)  Height: 5\' 1"  (1.549 m)   Constitutional: no acute distress Head: atraumatic ENT: external ears normal Cardiovascular: regular rate and rhythm, normal heart sounds, 2+ radial and DP pulses Pulmonary: effort normal, normal breath sounds bilaterally Abdominal: flat Skin: warm and dry, no foot ulcers, there is some mildly dry skin bilaterally Neurological: alert, no focal deficit Psychiatric:  normal mood and affect  Assessment & Plan:   See Encounters Tab for problem based charting.  Patient discussed with Dr. 

## 2020-04-15 ENCOUNTER — Other Ambulatory Visit (HOSPITAL_COMMUNITY): Payer: Self-pay | Admitting: Student

## 2020-04-15 ENCOUNTER — Ambulatory Visit (INDEPENDENT_AMBULATORY_CARE_PROVIDER_SITE_OTHER): Payer: Self-pay | Admitting: Student

## 2020-04-15 ENCOUNTER — Encounter: Payer: Self-pay | Admitting: Student

## 2020-04-15 ENCOUNTER — Other Ambulatory Visit: Payer: Self-pay

## 2020-04-15 DIAGNOSIS — E119 Type 2 diabetes mellitus without complications: Secondary | ICD-10-CM | POA: Insufficient documentation

## 2020-04-15 DIAGNOSIS — Z0000 Encounter for general adult medical examination without abnormal findings: Secondary | ICD-10-CM

## 2020-04-15 MED ORDER — CONTOUR NEXT TEST VI STRP: ORAL_STRIP | 12 refills | Status: DC

## 2020-04-15 MED ORDER — FREESTYLE LANCETS MISC: 12 refills | Status: DC

## 2020-04-15 MED ORDER — CONTOUR NEXT ONE DEVI: 1.0000 [IU] | Freq: Every day | 0 refills | Status: AC

## 2020-04-15 MED ORDER — FREESTYLE TEST VI STRP: ORAL_STRIP | 12 refills | Status: DC

## 2020-04-15 MED ORDER — MICROLET LANCETS MISC: 11 refills | Status: AC

## 2020-04-15 MED FILL — MICROLET LANCETS MISC: 30 days supply | Qty: 100 | Fill #0

## 2020-04-15 MED FILL — CONTOUR NEXT TEST STRP: 25 days supply | Qty: 25 | Fill #0

## 2020-04-15 MED FILL — CONTOUR NEXT METER: W/DEVICE | 1 days supply | Qty: 1 | Fill #0

## 2020-04-15 NOTE — Assessment & Plan Note (Signed)
Had A1c of 7.0 at [facility] on 03/31/2020. Reports occasionally feeling dryness or discomfort to her feet, feeling like they are "raw."  On exam they do appear dry, no ulcers, good sensation, 2+ DP pulses bilaterally. She has made diet changes including cutting out sugar, eating more garlic, and drinking green tea. Discussed further changes including carbohydrates. Provided "Living Well with Diabetes" pamphlet. She does note 1 episode of dizziness which improved with Gatorade.  -continue lifestyle modifications -f/u in 3 months for A1c recheck, may add metformin if above goal at that time -instructed to buy glucometer and check blood sugar if having more episodes of dizziness -deferring tests until she gets [facility] Card: protein/creatinine ratio, BMP, and refer for DM exam

## 2020-04-15 NOTE — Assessment & Plan Note (Signed)
-  obtain medical records from prior PCP, Dr. Abigail Miyamoto at [facility]. -apply for [employer]

## 2020-04-15 NOTE — Patient Instructions (Signed)
Thank you for allowing [place] to be a part of your care today, it was a pleasure seeing you. We discussed your diabetes.  Please continue making changes to your diet and exercise to improve you blood sugar. I am providing a few handouts of information as well which may be helpful. I will see you again in 3 months and recheck your A1c at that time.  Please purchase a glucometer to monitor your blood sugar. The cheapest option is the Relion brand from [employer], and also purchase some glucose test strips. If you feel like your blood sugar may be low, check your blood sugar according to the instructions. Give [place] a call if this is the case.  This office visit will be covered by the [facility] program. The front desk will schedule you to see our financial counselor to apply for the [facility] Card which can help cover visits and lab tests. I will hold off on screening tests until you get this assistance. In the future, we need to check your kidney function, urine protein-creatine ratio, and do a diabetic eye exam.  Please follow up in 3 months   Thank you, and please call the Internal Medicine Clinic at [phone] if you have any questions.  Best, Dr. Imogene Burn

## 2020-04-16 NOTE — Progress Notes (Signed)
Internal Medicine Clinic Attending  Case discussed with Dr. Chen  At the time of the visit.  We reviewed the resident's history and exam and pertinent patient test results.  I agree with the assessment, diagnosis, and plan of care documented in the resident's note. 

## 2020-04-21 NOTE — Addendum Note (Signed)
Addended by: Remo Lipps on: 04/21/2020 03:04 PM   Modules accepted: Level of Service

## 2020-04-29 MED FILL — CONTOUR NEXT TEST STRP: 25 days supply | Qty: 25 | Fill #1

## 2020-05-06 ENCOUNTER — Ambulatory Visit: Payer: No Typology Code available for payment source

## 2020-05-10 ENCOUNTER — Ambulatory Visit: Payer: No Typology Code available for payment source | Admitting: Obstetrics and Gynecology

## 2020-06-02 ENCOUNTER — Telehealth: Payer: Self-pay

## 2020-06-02 ENCOUNTER — Other Ambulatory Visit (HOSPITAL_COMMUNITY): Payer: Self-pay | Admitting: Internal Medicine

## 2020-06-02 MED ORDER — CONTOUR NEXT TEST VI STRP: ORAL_STRIP | 12 refills | Status: AC

## 2020-06-02 MED FILL — CONTOUR NEXT STRIPS: 25 days supply | Qty: 50 | Fill #0

## 2020-06-02 MED FILL — MICROLET LANCETS MISC: 30 days supply | Qty: 100 | Fill #1

## 2020-06-02 NOTE — Telephone Encounter (Signed)
Requesting to speak with a nurse about getting more on the test strips, please call pt back.

## 2020-06-02 NOTE — Telephone Encounter (Signed)
#  100 with 12 refills sent to [facility] on 04/15/2020. Call placed to patient. States she is only receiving 25 strips at a time. Sometimes she doesn't get big enough drop of blood and meter tells her to use a new strip. Spoke with Chales Abrahams at [facility]. States Rx sent on 04/15/2020 stated use as dir so they didn't know how many times per day patient was testing. Strips come in 25 or 50. IM Program only pays for 30 days. States patient p/u 25 strips on 2/10, 2/24, and 3/18 so she should have enough. Explained patient is sometimes using multiple strips. Chales Abrahams advised we send new Rx stating to test twice daily so they can give her 50 strips at one time.

## 2020-06-30 ENCOUNTER — Other Ambulatory Visit (HOSPITAL_COMMUNITY): Payer: Self-pay

## 2020-06-30 ENCOUNTER — Telehealth: Payer: Self-pay

## 2020-06-30 MED FILL — Glucose Blood Test Strip: 25 days supply | Qty: 50 | Fill #0 | Status: AC

## 2020-06-30 NOTE — Telephone Encounter (Signed)
Pls contact pt [phone]

## 2020-06-30 NOTE — Telephone Encounter (Signed)
Spoke with pt-uses contour next test strips Gets filled through IM program and will pay $4 #50 confirmed with pharmacy-they are currently working on this request.   Pt feels pharmacy level of service is "less than" because she is uninsured, because they also ask about her insurance status whenever she picks up from there.  CMA states it's pharmacy policy to do so and apologized for her feeling that way, but insisted that she pick up the test strips.  Pt agreed.  No further action needed at this time, phone call complete.Olivia Spittle Cassady4/27/202210:18 AM

## 2020-07-15 ENCOUNTER — Ambulatory Visit: Payer: No Typology Code available for payment source | Admitting: Dietician

## 2020-07-15 ENCOUNTER — Encounter: Payer: Self-pay | Admitting: Dietician

## 2020-07-15 ENCOUNTER — Ambulatory Visit (INDEPENDENT_AMBULATORY_CARE_PROVIDER_SITE_OTHER): Payer: Self-pay | Admitting: Student

## 2020-07-15 ENCOUNTER — Encounter: Payer: Self-pay | Admitting: Student

## 2020-07-15 ENCOUNTER — Other Ambulatory Visit: Payer: Self-pay

## 2020-07-15 VITALS — BP 139/81 | HR 70 | Temp 98.4°F | Ht 61.0 in | Wt 148.7 lb

## 2020-07-15 DIAGNOSIS — I10 Essential (primary) hypertension: Secondary | ICD-10-CM

## 2020-07-15 DIAGNOSIS — Z0000 Encounter for general adult medical examination without abnormal findings: Secondary | ICD-10-CM

## 2020-07-15 DIAGNOSIS — E119 Type 2 diabetes mellitus without complications: Secondary | ICD-10-CM

## 2020-07-15 LAB — POCT GLYCOSYLATED HEMOGLOBIN (HGB A1C): Hemoglobin A1C: 6.2 % — AB (ref 4.0–5.6)

## 2020-07-15 LAB — GLUCOSE, CAPILLARY: Glucose-Capillary: 103 mg/dL — ABNORMAL HIGH (ref 70–99)

## 2020-07-15 LAB — HM DIABETES EYE EXAM

## 2020-07-15 NOTE — Assessment & Plan Note (Signed)
Health maintenance - Pneumonia shot deferred, she like to avoid vaccinations.  We discussed the long-term benefits and I do recommend this, she will consider this in the future - COVID-vaccine declined.  Counseled that I still recommend this - HIV screening deferred until she has insurance - Hepatitis C screening deferred until she has insurance - Tdap documented

## 2020-07-15 NOTE — Patient Instructions (Signed)
  You had photos taken of your retinas (part of your eyes).    These photos were sent to eye doctors in [place] to see if diabetes is affecting the back of your eye.    If the doctor in [place] tells us that you should see an eye doctor, we will call you.   If the doctor in [place] says to return in 1 year. You will get a copy of the report in the mail.    Thank you for allowing us to help with your diabetes and vision care today!   Sincerely,  Chanan Detwiler [phone]  

## 2020-07-15 NOTE — Assessment & Plan Note (Signed)
Lab Results  Component Value Date   HGBA1C 6.2 (A) 07/15/2020   HGBA1C 7.0 (H) 03/31/2020  At last visit, patient declined medications and wanted to continue with lifestyle modifications first.  She has been very successful with this, see A1c reduction above.  Home blood sugar readings of 100s to 130s fasting. Feet were noted to be dry on last exam, today they are less dry.  Good DP pulses, no signs of numbness or ulcers.  -Protein creatinine ratio deferred until she has insurance coverage - Diabetic retinal camera exam - Patient will continue diet changes, follow-up in 6 months

## 2020-07-15 NOTE — Progress Notes (Signed)
   CC: Diabetes  HPI:  [name] is a 60 y.o. female with history as below presenting for follow-up on diabetes. Please refer to problem based charting for further details of assessment and plan of current problem and chronic medical conditions.  Past Medical History:  Diagnosis Date  . Diabetes (HCC)   . Hypertension   . Overweight (BMI 25.0-29.9) 11/14/2010  . Routine general medical examination at a health care facility 11/14/2010   Review of Systems:   Review of Systems  Respiratory: Negative for cough and shortness of breath.   Cardiovascular: Negative for chest pain and orthopnea.  Gastrointestinal: Negative for abdominal pain, nausea and vomiting.  Neurological: Positive for dizziness.  All other systems reviewed and are negative.    Physical Exam: Vitals:   07/15/20 0946 07/15/20 0952  BP: (!) 145/79 139/81  Pulse: 73 70  Temp: 98.4 F (36.9 C)   TempSrc: Oral   SpO2: 100%   Weight: 148 lb 11.2 oz (67.4 kg)   Height: 5\' 1"  (1.549 m)    Constitutional: no acute distress, well-appearing Head: atraumatic ENT: external ears normal Cardiovascular: regular rate and rhythm, normal heart sounds Pulmonary: effort normal, normal breath sounds bilaterally Abdominal: flat Musculoskeletal: Foot exam benign, no ulcers, no dry skin Skin: warm and dry Neurological: alert, no focal deficit Psychiatric: Anxious  Assessment & Plan:   See Encounters Tab for problem based charting.  Patient discussed with Dr. 

## 2020-07-15 NOTE — Progress Notes (Signed)
Retinal images were done today as part of this patient's yearly diabetes health maintenance program. They were transmitted electronically to an ophthalmologist who will interpret them and send a report back with their findings. This was explained to the patient and they were also informed that the results would be communicated to them either by phone,  mail or both.  Norm Parcel, RD 07/15/2020 11:26 AM.

## 2020-07-15 NOTE — Assessment & Plan Note (Signed)
BP: 139/81   Patient has never been on blood pressure medications in the past.  She brings a home blood pressure log which typically has readings from 130s to 140s systolic over 80s to 90s diastolic.  Occasionally has a reading in the 120s systolic.  Patient prefers to avoid medications at this time and wants to make lifestyle changes. Denies chest pain, palpitations, headaches, LOC.    - Discussed low-salt and DASH diet - Patient will bring blood pressure log to next visit - Follow-up in 6 months

## 2020-07-15 NOTE — Patient Instructions (Signed)
Thank you for allowing [place] to be a part of your care today, it was a pleasure seeing you. We discussed your diabetes, blood pressure, and preventive healthcare  Congratulations!  Your A1c is at her 66 with your diet changes.  Keep this up.  Your blood pressure today, and your blood pressure log showed that it is mildly elevated typically.  As we restarted, we will hold off starting medications for now.  Try to maintain a low-salt diet.  I have also included information about the DASH diet which is proven to help reduce blood pressure.  Please keep a log of your blood pressure and blood sugar at least once a week and bring it to next visit.  Consider getting the pneumonia shot in the future, preferably after you have health insurance coverage.  This is one that I think will help you stay healthy going forward.  Please follow up in 6 months   Thank you, and please call the Internal Medicine Clinic at [phone] if you have any questions.  Best, Dr. Imogene Burn

## 2020-07-17 NOTE — Progress Notes (Signed)
Internal Medicine Clinic Attending  Case discussed with Dr. Chen  At the time of the visit.  We reviewed the resident's history and exam and pertinent patient test results.  I agree with the assessment, diagnosis, and plan of care documented in the resident's note. 

## 2020-07-22 ENCOUNTER — Other Ambulatory Visit (HOSPITAL_COMMUNITY): Payer: Self-pay

## 2020-07-22 ENCOUNTER — Encounter (INDEPENDENT_AMBULATORY_CARE_PROVIDER_SITE_OTHER): Payer: Self-pay | Admitting: Dietician

## 2020-07-22 DIAGNOSIS — E119 Type 2 diabetes mellitus without complications: Secondary | ICD-10-CM

## 2020-07-22 MED FILL — Lancets: 30 days supply | Qty: 100 | Fill #0 | Status: AC

## 2020-07-22 MED FILL — Glucose Blood Test Strip: 25 days supply | Qty: 50 | Fill #1 | Status: AC

## 2020-07-26 ENCOUNTER — Encounter: Payer: Self-pay | Admitting: Internal Medicine

## 2020-07-26 ENCOUNTER — Telehealth: Payer: Self-pay | Admitting: Internal Medicine

## 2020-07-26 DIAGNOSIS — H3530 Unspecified macular degeneration: Secondary | ICD-10-CM

## 2020-07-26 NOTE — Telephone Encounter (Signed)
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 Referral placed for opthalmology.

## 2020-08-13 ENCOUNTER — Other Ambulatory Visit (HOSPITAL_COMMUNITY): Payer: Self-pay

## 2020-08-13 MED FILL — Glucose Blood Test Strip: 25 days supply | Qty: 50 | Fill #2 | Status: AC

## 2020-08-13 MED FILL — Lancets: 30 days supply | Qty: 100 | Fill #1 | Status: AC

## 2020-08-18 ENCOUNTER — Encounter: Payer: Self-pay | Admitting: *Deleted

## 2020-09-07 ENCOUNTER — Encounter: Payer: Self-pay | Admitting: *Deleted

## 2020-09-14 ENCOUNTER — Other Ambulatory Visit (HOSPITAL_COMMUNITY): Payer: Self-pay

## 2020-09-14 MED FILL — Glucose Blood Test Strip: 25 days supply | Qty: 50 | Fill #3 | Status: AC

## 2020-09-15 ENCOUNTER — Other Ambulatory Visit (HOSPITAL_COMMUNITY): Payer: Self-pay

## 2020-09-21 ENCOUNTER — Other Ambulatory Visit (HOSPITAL_COMMUNITY): Payer: Self-pay

## 2020-09-21 MED FILL — Lancets: 30 days supply | Qty: 100 | Fill #2 | Status: AC

## 2020-10-06 ENCOUNTER — Ambulatory Visit: Payer: Self-pay | Admitting: Student

## 2020-10-06 ENCOUNTER — Other Ambulatory Visit: Payer: Self-pay

## 2020-10-06 ENCOUNTER — Encounter: Payer: Self-pay | Admitting: Student

## 2020-10-06 VITALS — BP 144/79 | HR 80 | Temp 98.6°F | Ht 61.0 in | Wt 151.3 lb

## 2020-10-06 DIAGNOSIS — K069 Disorder of gingiva and edentulous alveolar ridge, unspecified: Secondary | ICD-10-CM

## 2020-10-06 DIAGNOSIS — R198 Other specified symptoms and signs involving the digestive system and abdomen: Secondary | ICD-10-CM

## 2020-10-06 DIAGNOSIS — E119 Type 2 diabetes mellitus without complications: Secondary | ICD-10-CM

## 2020-10-06 DIAGNOSIS — H3530 Unspecified macular degeneration: Secondary | ICD-10-CM

## 2020-10-06 DIAGNOSIS — I10 Essential (primary) hypertension: Secondary | ICD-10-CM

## 2020-10-06 LAB — POCT GLYCOSYLATED HEMOGLOBIN (HGB A1C): Hemoglobin A1C: 6.4 % — AB (ref 4.0–5.6)

## 2020-10-06 LAB — GLUCOSE, CAPILLARY: Glucose-Capillary: 137 mg/dL — ABNORMAL HIGH (ref 70–99)

## 2020-10-06 NOTE — Patient Instructions (Signed)
Ms. Gallen,  It was nice seeing you in the clinic today.  Here is a summary of what we talked about:  1.  Pale gum: I will check your blood count to rule out anemia.  2.  High blood pressure: Please follow a low-salt diet and exercise.  Please keep a log of your blood pressure and bring it to the next visit.  3.  Diabetes: Your A1c is still within a good range.  Please continue eating healthy.  Please return in 1 month  Take care,  Dr. Cyndie Chime

## 2020-10-07 DIAGNOSIS — K069 Disorder of gingiva and edentulous alveolar ridge, unspecified: Secondary | ICD-10-CM | POA: Insufficient documentation

## 2020-10-07 DIAGNOSIS — H3530 Unspecified macular degeneration: Secondary | ICD-10-CM | POA: Insufficient documentation

## 2020-10-07 LAB — CBC
Hematocrit: 46.1 % (ref 34.0–46.6)
Hemoglobin: 15.3 g/dL (ref 11.1–15.9)
MCH: 29.2 pg (ref 26.6–33.0)
MCHC: 33.2 g/dL (ref 31.5–35.7)
MCV: 88 fL (ref 79–97)
Platelets: 343 10*3/uL (ref 150–450)
RBC: 5.24 x10E6/uL (ref 3.77–5.28)
RDW: 12.6 % (ref 11.7–15.4)
WBC: 9.4 10*3/uL (ref 3.4–10.8)

## 2020-10-07 NOTE — Assessment & Plan Note (Signed)
A1c slightly elevated at 6.4 but still within acceptable range.  -Continue lifestyle modification

## 2020-10-07 NOTE — Progress Notes (Signed)
   CC: pale gum  HPI:  [name] is a 60 y.o. with past medical history of hypertension, type 2 diabetes, who presented to the clinic for evaluation of her gums.  Please see problem based charting for detail  Past Medical History:  Diagnosis Date   Diabetes (HCC)    Hypertension    Overweight (BMI 25.0-29.9) 11/14/2010   Routine general medical examination at a health care facility 11/14/2010   Review of Systems:   Per HPI  Physical Exam:  Vitals:   10/06/20 1509  BP: (!) 144/79  Pulse: 80  Temp: 98.6 F (37 C)  TempSrc: Oral  SpO2: 99%  Weight: 151 lb 4.8 oz (68.6 kg)  Height: 5\' 1"  (1.549 m)   Physical Exam Constitutional:      General: She is not in acute distress.    Appearance: She is not toxic-appearing.  HENT:     Head: Normocephalic.     Mouth/Throat:     Comments: Her gingiva appears slightly pale throughout.  No bleeding, swelling or purulent discharge.  Dentition appears normal.  No other abnormalities seen in the oral cavity Eyes:     Comments: No obvious conjunctival pallor  Cardiovascular:     Rate and Rhythm: Normal rate and regular rhythm.     Heart sounds: Normal heart sounds.  Pulmonary:     Effort: Pulmonary effort is normal. No respiratory distress.     Breath sounds: Normal breath sounds. No wheezing.  Musculoskeletal:        General: Normal range of motion.     Comments: No palmar pallor  Skin:    General: Skin is warm.     Coloration: Skin is not jaundiced.  Neurological:     Mental Status: She is alert and oriented to person, place, and time.  Psychiatric:        Mood and Affect: Mood normal.     Assessment & Plan:   See Encounters Tab for problem based charting.  Patient discussed with Dr. 

## 2020-10-07 NOTE — Assessment & Plan Note (Signed)
Redness skin did not show any diabetic disease of her eyes.  However showed age-related macular degeneration.  The report recommended follow-up with an ophthalmologist.  Patient does not have any insurance right now.  Advised patient to reapply for the orange card to help cover for expenses of specialist.  She will let us know if there are any sudden change in vision.

## 2020-10-07 NOTE — Assessment & Plan Note (Signed)
Patient reports pale appearance of her gingiva that she noticed 1 day ago.  She denies any fever, chills, pain, bleeding or swelling of her gums.  Denies any symptoms except for gingival pallor.  She reports mild fatigue that is chronic due to working long hours.  She also reports weight gain which she contributed to her unhealthy eating habits.  She denies any bleeding source including vaginal GI.  Assessment and plan Physical exam is unremarkable except for very mild gingival pallor.  No signs of infection or gum disease.  CBC was obtained to rule out anemia.  Hemoglobin came back within normal limits.  Advised patient to monitor for now and notify us if anything change.  Due to the lack of insurance coverage, she currently does not have a Education officer, community.

## 2020-10-07 NOTE — Assessment & Plan Note (Addendum)
Blood pressure 144/79.  Repeat 149/84.  She was offered medication for hypertension last visit but decided to do lifestyle modification.  Patient states that she has been eating out more often recently.   I explained the risk of having untreated hypertension.  Patient verbalized understanding and would like to continue with lifestyle modifications.  She will try low-salt diet and exercise.    Advised patient to measure her blood pressure at home and keep a log and bring it to her next visit.  If remains elevated, will discuss starting a medication.  -Return in 1 month

## 2020-10-11 NOTE — Progress Notes (Signed)
Internal Medicine Clinic Attending  Case discussed with Dr. Nguyen  At the time of the visit.  We reviewed the resident's history and exam and pertinent patient test results.  I agree with the assessment, diagnosis, and plan of care documented in the resident's note. 

## 2020-10-20 ENCOUNTER — Other Ambulatory Visit (HOSPITAL_COMMUNITY): Payer: Self-pay

## 2020-10-20 MED FILL — Glucose Blood Test Strip: 25 days supply | Qty: 50 | Fill #4 | Status: AC

## 2020-10-20 MED FILL — Lancets: 30 days supply | Qty: 100 | Fill #3 | Status: AC

## 2020-11-02 ENCOUNTER — Encounter: Payer: Self-pay | Admitting: Student

## 2020-11-02 ENCOUNTER — Other Ambulatory Visit: Payer: Self-pay

## 2020-11-02 ENCOUNTER — Ambulatory Visit (INDEPENDENT_AMBULATORY_CARE_PROVIDER_SITE_OTHER): Payer: Self-pay | Admitting: Student

## 2020-11-02 DIAGNOSIS — I10 Essential (primary) hypertension: Secondary | ICD-10-CM

## 2020-11-02 NOTE — Assessment & Plan Note (Signed)
Blood pressure 131/70 today.  We compared it with her home blood pressure cuff and it was consistent.  Patient brought her blood pressure log.  Most of the systolic are in the 120s with normal pulse.  Patient states that she has been cutting back on her salt intake.  She also try to exercise but not much given the hot weather.  -Given her good blood pressure, no medication initiated today. -Continue low-salt diet and exercise.

## 2020-11-02 NOTE — Patient Instructions (Signed)
Ms. Tlatelpa,  It was a pleasure seeing you in the clinic today.  He is a summary what we talked about.  1.  High blood pressure: Your blood pressure looks great.  Please continue a low-salt diet and exercise.  2.  Type 2 diabetes: Your A1c 3 weeks ago was 6.4.  Please continue cut back on starchy food and sugary food.  Continue exercise if able.  Please return in 6 months, sooner if needed  Take care,  Dr. Cyndie Chime

## 2020-11-02 NOTE — Progress Notes (Signed)
Internal Medicine Clinic Attending  Case discussed with Dr. Nguyen  At the time of the visit.  We reviewed the resident's history and exam and pertinent patient test results.  I agree with the assessment, diagnosis, and plan of care documented in the resident's note. 

## 2020-11-02 NOTE — Progress Notes (Signed)
   CC: 4-week follow-up on blood pressure  HPI:  Olivia Graham is a 60 y.o. with past medical history of hypertension, diabetes who presents to the clinic today for follow-up on her blood pressure.  Please see problem based charting for detailed  Past Medical History:  Diagnosis Date   Diabetes (HCC)    Hypertension    Overweight (BMI 25.0-29.9) 11/14/2010   Routine general medical examination at a health care facility 11/14/2010   Review of Systems: Per HPI  Physical Exam:  Vitals:   11/02/20 0856  BP: 130/71  Pulse: 75  Resp: 18  SpO2: 98%  Weight: 151 lb 11.2 oz (68.8 kg)  Height: 5\' 1"  (1.549 m)   Physical Exam Constitutional:      General: She is not in acute distress.    Appearance: She is not toxic-appearing.  HENT:     Head: Normocephalic.     Mouth/Throat:     Comments: The gingiva appears less pale compared to last visit. Eyes:     Conjunctiva/sclera: Conjunctivae normal.  Cardiovascular:     Rate and Rhythm: Normal rate and regular rhythm.     Heart sounds: Normal heart sounds.  Pulmonary:     Effort: No respiratory distress.     Breath sounds: Normal breath sounds. No wheezing.  Musculoskeletal:        General: Normal range of motion.  Skin:    General: Skin is warm.  Neurological:     Mental Status: She is alert and oriented to person, place, and time.  Psychiatric:        Mood and Affect: Mood normal.        Behavior: Behavior normal.     Assessment & Plan:   See Encounters Tab for problem based charting.  Patient discussed with Dr. 

## 2020-11-02 NOTE — Assessment & Plan Note (Signed)
Advised patient to continue to cut back on carbohydrates and sugary food.  -A1c in 6 months -Patient had an eye exam this year

## 2020-12-01 ENCOUNTER — Other Ambulatory Visit (HOSPITAL_COMMUNITY): Payer: Self-pay

## 2020-12-01 MED FILL — Glucose Blood Test Strip: 25 days supply | Qty: 50 | Fill #5 | Status: AC

## 2020-12-06 ENCOUNTER — Other Ambulatory Visit (HOSPITAL_COMMUNITY): Payer: Self-pay

## 2020-12-06 MED FILL — Lancets: 30 days supply | Qty: 100 | Fill #4 | Status: AC

## 2020-12-28 ENCOUNTER — Other Ambulatory Visit: Payer: Self-pay

## 2020-12-28 MED FILL — Glucose Blood Test Strip: 25 days supply | Qty: 50 | Fill #6 | Status: AC

## 2020-12-28 MED FILL — Lancets: 30 days supply | Qty: 100 | Fill #5 | Status: AC

## 2020-12-29 ENCOUNTER — Other Ambulatory Visit (HOSPITAL_COMMUNITY): Payer: Self-pay

## 2020-12-29 ENCOUNTER — Other Ambulatory Visit: Payer: Self-pay | Admitting: Student

## 2020-12-30 ENCOUNTER — Other Ambulatory Visit: Payer: Self-pay | Admitting: Student

## 2020-12-30 ENCOUNTER — Telehealth: Payer: Self-pay | Admitting: Dietician

## 2020-12-30 ENCOUNTER — Other Ambulatory Visit (HOSPITAL_COMMUNITY): Payer: Self-pay

## 2020-12-30 DIAGNOSIS — E119 Type 2 diabetes mellitus without complications: Secondary | ICD-10-CM

## 2020-12-30 NOTE — Telephone Encounter (Signed)
Wants to check her blood sugar and to discuss her blood sugar values.

## 2020-12-30 NOTE — Telephone Encounter (Signed)
Spoke with patient about options for a new meterProduction assistant, radio or I could leave one at th front desk) . She wanted prescription for new sent to pharmacy.

## 2020-12-31 ENCOUNTER — Other Ambulatory Visit (HOSPITAL_COMMUNITY): Payer: Self-pay

## 2020-12-31 MED ORDER — CONTOUR NEXT MONITOR W/DEVICE KIT
PACK | 0 refills | Status: AC
  Filled 2020-12-31: qty 1, 1d supply, fill #0

## 2021-01-26 ENCOUNTER — Other Ambulatory Visit (HOSPITAL_COMMUNITY): Payer: Self-pay

## 2021-01-26 MED FILL — Lancets: 30 days supply | Qty: 100 | Fill #6 | Status: AC

## 2021-01-26 MED FILL — Glucose Blood Test Strip: 25 days supply | Qty: 50 | Fill #7 | Status: AC

## 2021-03-16 ENCOUNTER — Other Ambulatory Visit (HOSPITAL_COMMUNITY): Payer: Self-pay

## 2021-03-16 MED FILL — Glucose Blood Test Strip: 25 days supply | Qty: 50 | Fill #8 | Status: AC

## 2021-03-16 MED FILL — Lancets: 30 days supply | Qty: 100 | Fill #7 | Status: AC

## 2021-04-20 ENCOUNTER — Encounter: Payer: Self-pay | Admitting: Student

## 2021-04-20 ENCOUNTER — Ambulatory Visit (INDEPENDENT_AMBULATORY_CARE_PROVIDER_SITE_OTHER): Payer: PRIVATE HEALTH INSURANCE | Admitting: Student

## 2021-04-20 VITALS — BP 126/70 | HR 79 | Temp 98.2°F | Ht 61.0 in | Wt 152.2 lb

## 2021-04-20 DIAGNOSIS — E119 Type 2 diabetes mellitus without complications: Secondary | ICD-10-CM

## 2021-04-20 DIAGNOSIS — I10 Essential (primary) hypertension: Secondary | ICD-10-CM

## 2021-04-20 DIAGNOSIS — Z0000 Encounter for general adult medical examination without abnormal findings: Secondary | ICD-10-CM

## 2021-04-20 LAB — POCT GLYCOSYLATED HEMOGLOBIN (HGB A1C): Hemoglobin A1C: 6.4 % — AB (ref 4.0–5.6)

## 2021-04-20 LAB — GLUCOSE, CAPILLARY: Glucose-Capillary: 82 mg/dL (ref 70–99)

## 2021-04-20 NOTE — Patient Instructions (Signed)
Olivia Graham,  It was a pleasure seeing you in the clinic today.   Diabetes: Your A1C is 6.4. You are doing a great job. I will check your kidney function today.  2.  Your blood pressure also looks great.  3. No new medications for your cholesterol today. We will recheck your lipid panel in a few years.  Please return in 6 months  Take care  Dr. Cyndie Chime

## 2021-04-20 NOTE — Assessment & Plan Note (Addendum)
Diabetes well controlled with diet.  A1c of 6.4 today.  She had a normal eye exam last year with [facility].  Denies any wound or ulcers of bilateral feet.  Denies any neuropathy. -A1c in 6 months -Encouraged continue healthy diet and exercise -Obtain BMP and urine microalbumin

## 2021-04-20 NOTE — Assessment & Plan Note (Signed)
Blood pressure remains well controlled without medications.

## 2021-04-20 NOTE — Assessment & Plan Note (Signed)
-  She declined all vaccines including COVID-vaccine. -Says she has had HIV and hepatitis C checked with her OB/GYN.  She will bring the results at next visit.

## 2021-04-20 NOTE — Progress Notes (Signed)
° °  CC: F/u on DM and HTN  HPI:  Olivia Graham is a 61 y.o. with PMH of T2DM, HTN who presents to the clinic today to follow up on her blood pressure.  Please see problem based charting for detail.  Past Medical History:  Diagnosis Date   Diabetes (HCC)    Hypertension    Overweight (BMI 25.0-29.9) 11/14/2010   Routine general medical examination at a health care facility 11/14/2010   Review of Systems:  per HPI  Physical Exam:  Vitals:   04/20/21 1323  BP: 126/70  Pulse: 79  Temp: 98.2 F (36.8 C)  TempSrc: Oral  SpO2: 97%  Weight: 152 lb 3.2 oz (69 kg)  Height: 5\' 1"  (1.549 m)   Physical Exam Constitutional:      General: She is not in acute distress.    Appearance: She is not ill-appearing.  HENT:     Head: Normocephalic.  Eyes:     General:        Right eye: No discharge.        Left eye: No discharge.     Conjunctiva/sclera: Conjunctivae normal.  Cardiovascular:     Rate and Rhythm: Normal rate and regular rhythm.  Pulmonary:     Effort: Pulmonary effort is normal. No respiratory distress.     Breath sounds: Normal breath sounds.  Musculoskeletal:        General: Normal range of motion.  Skin:    General: Skin is warm.  Neurological:     General: No focal deficit present.     Mental Status: She is alert and oriented to person, place, and time.  Psychiatric:        Mood and Affect: Mood normal.        Behavior: Behavior normal.     Assessment & Plan:   See Encounters Tab for problem based charting.  Patient discussed with Dr. 

## 2021-04-21 LAB — BMP8+ANION GAP
Anion Gap: 16 mmol/L (ref 10.0–18.0)
BUN/Creatinine Ratio: 13 (ref 9–23)
BUN: 11 mg/dL (ref 6–24)
CO2: 23 mmol/L (ref 20–29)
Calcium: 10.1 mg/dL (ref 8.7–10.2)
Chloride: 104 mmol/L (ref 96–106)
Creatinine, Ser: 0.84 mg/dL (ref 0.57–1.00)
Glucose: 85 mg/dL (ref 70–99)
Potassium: 4.5 mmol/L (ref 3.5–5.2)
Sodium: 143 mmol/L (ref 134–144)
eGFR: 80 mL/min/{1.73_m2} (ref >59)

## 2021-04-21 LAB — MICROALBUMIN / CREATININE URINE RATIO
Creatinine, Urine: 187.4 mg/dL
Microalb/Creat Ratio: 5 mg/g creat (ref 0–29)
Microalbumin, Urine: 9.2 ug/mL

## 2021-04-21 NOTE — Progress Notes (Signed)
Internal Medicine Clinic Attending  Case discussed with Dr. Nguyen  At the time of the visit.  We reviewed the resident's history and exam and pertinent patient test results.  I agree with the assessment, diagnosis, and plan of care documented in the resident's note. 

## 2021-04-27 ENCOUNTER — Other Ambulatory Visit: Payer: Self-pay | Admitting: Student

## 2021-04-27 DIAGNOSIS — Z1231 Encounter for screening mammogram for malignant neoplasm of breast: Secondary | ICD-10-CM

## 2021-05-20 ENCOUNTER — Other Ambulatory Visit (HOSPITAL_COMMUNITY): Payer: Self-pay

## 2021-05-20 ENCOUNTER — Other Ambulatory Visit: Payer: Self-pay | Admitting: Student

## 2021-05-20 MED FILL — Glucose Blood Test Strip: 25 days supply | Qty: 50 | Fill #9 | Status: CN

## 2021-05-23 ENCOUNTER — Other Ambulatory Visit (HOSPITAL_COMMUNITY): Payer: Self-pay

## 2021-05-23 MED ORDER — MICROLET LANCETS MISC
11 refills | Status: AC
  Filled 2021-05-23: qty 100, 100d supply, fill #0

## 2021-05-25 ENCOUNTER — Telehealth: Payer: No Typology Code available for payment source

## 2021-05-25 NOTE — Telephone Encounter (Signed)
Spoke with patient about self monitoring blood sugar. I suggested if okay with her doctor that maybe she can self monitor her well controlled diet controlled diabetes  by keeping up with what her A1C is and what that means. Explained that to her today and she verbalized understanding and wants to be sure this is okay with her doctor.  ? ?She has questions about mammogram. ?

## 2021-05-25 NOTE — Telephone Encounter (Signed)
Pt states insurance will not covered test strips and Lancets. Please call pt back.  ?

## 2021-05-25 NOTE — Telephone Encounter (Signed)
Yes- I have sent an email to her insurance company about their preferred meter and am waiitn g ot hear. I'll call her today.

## 2021-05-26 ENCOUNTER — Ambulatory Visit: Payer: No Typology Code available for payment source

## 2021-05-31 ENCOUNTER — Other Ambulatory Visit (HOSPITAL_COMMUNITY): Payer: Self-pay

## 2021-05-31 NOTE — Telephone Encounter (Addendum)
Informed pt that she can use A1C to self-monitor her diabetes rather than checking her blood sugar.  ? ?Raised mole on arm. Spoke to triage nurse who advised pt to contact her dermatologist. Pt agreed to contact dermatologist for mole to see if insurance will cover an appointment with them. Pt reports her insurance won't cover [facility].  ? ?

## 2021-06-01 ENCOUNTER — Other Ambulatory Visit: Payer: Self-pay | Admitting: Student in an Organized Health Care Education/Training Program

## 2021-06-01 DIAGNOSIS — Z1231 Encounter for screening mammogram for malignant neoplasm of breast: Secondary | ICD-10-CM

## 2021-06-01 NOTE — Telephone Encounter (Signed)
Thank you :)

## 2021-06-06 ENCOUNTER — Encounter: Payer: Self-pay | Admitting: Student

## 2021-06-06 ENCOUNTER — Ambulatory Visit (INDEPENDENT_AMBULATORY_CARE_PROVIDER_SITE_OTHER): Payer: 59 | Admitting: Student

## 2021-06-06 DIAGNOSIS — Z0000 Encounter for general adult medical examination without abnormal findings: Secondary | ICD-10-CM

## 2021-06-06 DIAGNOSIS — H6121 Impacted cerumen, right ear: Secondary | ICD-10-CM

## 2021-06-06 DIAGNOSIS — L821 Other seborrheic keratosis: Secondary | ICD-10-CM

## 2021-06-06 NOTE — Assessment & Plan Note (Signed)
Impacted cerumen seen in the right ear.  Left ear is clear.  Nose and throat are normal. ? ?Patient endorses using Q-tips to clean her ear. ? ?-Will try to irrigate in the office.  If not successful, will recommend patient to use Debrox and saline irrigation. ?-Advised patient to avoid using Q-tips. ?

## 2021-06-06 NOTE — Patient Instructions (Signed)
Olivia Graham, ? ?It was a pleasure seeing you in the clinic today.  Here is a summary of what we talked about: ? ?1.  Your lesion appears benign.  I think it most likely is seborrheic keratosis or barnacles.  ? ?2.  Please try to avoid using Q-tips to clean ears.  If this happens again, you can use Debrox and saline over-the-counter for earwax. ? ?Please return in 6 months, sooner if needed. ? ?Take care, ? ?Dr. Cyndie Chime  ?

## 2021-06-06 NOTE — Assessment & Plan Note (Signed)
Patient is up-to-date with colonoscopy and Pap smear.  Schedule for mammogram in May. ?

## 2021-06-06 NOTE — Progress Notes (Addendum)
? ?  CC: mole on left arm ? ?HPI: ? ?[name] is a 61 y.o. present for right ear stuffiness and left arm mole. ? ?Please see problem based charting for detail ? ?Past Medical History:  ?Diagnosis Date  ? Diabetes (HCC)   ? Hypertension   ? Overweight (BMI 25.0-29.9) 11/14/2010  ? Routine general medical examination at a health care facility 11/14/2010  ? ?Review of Systems: Per HPI ? ?Physical Exam: ? ?Vitals:  ? 06/06/21 0930  ?BP: 138/67  ?Pulse: 71  ?Temp: 97.9 ?F (36.6 ?C)  ?TempSrc: Oral  ?SpO2: 100%  ?Weight: 152 lb 11.2 oz (69.3 kg)  ?Height: 5\' 1"  (1.549 m)  ? ?Physical Exam ?Constitutional:   ?   General: She is not in acute distress. ?   Appearance: Normal appearance.  ?HENT:  ?   Head: Normocephalic.  ?   Right Ear: External ear normal. There is impacted cerumen.  ?   Left Ear: Tympanic membrane and external ear normal. There is no impacted cerumen.  ?   Nose: Nose normal. No rhinorrhea.  ?   Mouth/Throat:  ?   Mouth: Mucous membranes are moist.  ?Eyes:  ?   General: No scleral icterus.    ?   Right eye: No discharge.     ?   Left eye: No discharge.  ?Pulmonary:  ?   Effort: Pulmonary effort is normal. No respiratory distress.  ?Skin: ?   General: Skin is warm.  ?   Comments: A small 2 mm diameter raised pigmented lesion seen on the left deltoid.  Border is sharp.  ?Neurological:  ?   General: No focal deficit present.  ?   Mental Status: She is alert.  ?Psychiatric:     ?   Mood and Affect: Mood normal.  ?  ? ?Assessment & Plan:  ? ?See Encounters Tab for problem based charting. ? ?Patient discussed with Dr.  ?

## 2021-06-06 NOTE — Assessment & Plan Note (Signed)
Her lesion in the left arm is consistent with seborrheic keratosis.  No alarming features.  Advised patient to keep monitoring and let us know if she develops any red flags. ?

## 2021-06-07 NOTE — Progress Notes (Signed)
Internal Medicine Clinic Attending  Case discussed with Dr. Nguyen  At the time of the visit.  We reviewed the resident's history and exam and pertinent patient test results.  I agree with the assessment, diagnosis, and plan of care documented in the resident's note. 

## 2021-07-07 ENCOUNTER — Ambulatory Visit (INDEPENDENT_AMBULATORY_CARE_PROVIDER_SITE_OTHER): Payer: 59

## 2021-07-07 DIAGNOSIS — Z1231 Encounter for screening mammogram for malignant neoplasm of breast: Secondary | ICD-10-CM

## 2021-08-11 ENCOUNTER — Telehealth: Payer: Self-pay | Admitting: Student

## 2021-08-11 NOTE — Telephone Encounter (Signed)
Return pt's call - no answer; left message [facility] returning her call and to call the office.

## 2021-08-11 NOTE — Telephone Encounter (Signed)
Pt is requesting a call back about her back pain.  The pt was sch for 08/15/2021 with Dr. Montez Morita but, called back and cancelled her appt and resch with Dr. Cyndie Chime for 09/07/2021 instead.

## 2021-08-11 NOTE — Telephone Encounter (Signed)
Call from pt who stating she had re-schedule her appt to see Dr Alfonse Spruce. Stated she would like to talk to Dr Alfonse Spruce about what else could be done for the back pain. I told her I would relay her message.

## 2021-08-15 ENCOUNTER — Encounter: Payer: 59 | Admitting: Student

## 2021-08-21 ENCOUNTER — Telehealth: Payer: Self-pay | Admitting: Student

## 2021-08-21 NOTE — Telephone Encounter (Signed)
Call back patient regarding her back pain.  She said that she had an injury of her upper back that she fell in 2022.  She reports exacerbation of her muscle pain in the left upper back.  She has taken Tylenol for pain.  Reports the pain has improved significantly now.  Advised patient to use lidocaine patch or Voltaren gel if needed.  Verbalized understanding.  All questions were answered.

## 2021-09-07 ENCOUNTER — Encounter: Payer: 59 | Admitting: Student

## 2021-12-19 ENCOUNTER — Ambulatory Visit: Payer: Self-pay

## 2021-12-19 NOTE — Telephone Encounter (Signed)
  Chief Complaint: knee pain  Symptoms: R hip and knee pain 5/10 Frequency: Saturday morning  Pertinent Negatives: Patient denies swelling Disposition: [] ED /[] Urgent Care (no appt availability in office) / [x] Appointment(In office/virtual)/ []  [place] [facility]/ [] Home Care/ [] Refused Recommended Disposition /[] [place] [facility]/ []  Follow-up with PCP Additional Notes: pt states she has been taking Ibuprofen and it helps with pain to make it tolerable but wanting to be seen to have it looked at. Has PCP but difficult to see provider so wanting to change practice. Scheduled NPA on 12/29/21 at 0920 at [place]. Care advice given and pt verbalized understanding. No other questions/concerns noted.   Reason for Disposition  [1] MODERATE pain (e.g., interferes with normal activities, limping) AND [2] present > 3 days  Answer Assessment - Initial Assessment Questions 1. LOCATION and RADIATION: "Where is the pain located?"      R knee  3. SEVERITY: "How bad is the pain?" "What does it keep you from doing?"   (Scale 1-10; or mild, moderate, severe)   -  MILD (1-3): doesn't interfere with normal activities    -  MODERATE (4-7): interferes with normal activities (e.g., work or school) or awakens from sleep, limping    -  SEVERE (8-10): excruciating pain, unable to do any normal activities, unable to walk     5/10 4. ONSET: "When did the pain start?" "Does it come and go, or is it there all the time?"     Saturday morning  8. ASSOCIATED SYMPTOMS: "Is there any swelling or redness of the knee?"     no 9. OTHER SYMPTOMS: "Do you have any other symptoms?" (e.g., chest pain, difficulty breathing, fever, calf pain)     Hip pain on R side as well.  Protocols used: Knee Pain-A-AH

## 2021-12-29 ENCOUNTER — Ambulatory Visit: Payer: Self-pay | Admitting: Family

## 2021-12-29 ENCOUNTER — Encounter: Payer: Self-pay | Admitting: Family Medicine

## 2021-12-29 ENCOUNTER — Ambulatory Visit (INDEPENDENT_AMBULATORY_CARE_PROVIDER_SITE_OTHER): Payer: Commercial Managed Care - HMO | Admitting: Family Medicine

## 2021-12-29 ENCOUNTER — Ambulatory Visit (INDEPENDENT_AMBULATORY_CARE_PROVIDER_SITE_OTHER)
Admission: RE | Admit: 2021-12-29 | Discharge: 2021-12-29 | Disposition: A | Discharge status: Home / Self Care | Payer: Commercial Managed Care - HMO | Source: Ambulatory Visit | Attending: Family Medicine | Admitting: Family Medicine

## 2021-12-29 VITALS — BP 110/88 | HR 84 | Temp 98.3°F | Ht 61.0 in | Wt 151.2 lb

## 2021-12-29 DIAGNOSIS — M25561 Pain in right knee: Secondary | ICD-10-CM

## 2021-12-29 NOTE — Patient Instructions (Addendum)
Welcome to [facility]! It was a pleasure meeting you today.  As discussed, Please schedule a 2 month follow up visit today.  get X-ray/labs at [facility].  [place]  hours 8=M-F 8:30-5.  closed 12:30-1 lunch   PLEASE NOTE:  If you had any LAB tests please let us know if you have not heard back within a few days. You may see your results on MyChart before we have a chance to review them but we will give you a call once they are reviewed by [place]. If we ordered any REFERRALS today, please let us know if you have not heard from their office within the next week.  Let us know through MyChart if you are needing REFILLS, or have your pharmacy send [place] the request. You can also use MyChart to communicate with me or any office staff.  Please try these tips to maintain a healthy lifestyle:  Eat most of your calories during the day when you are active. Eliminate processed foods including packaged sweets (pies, cakes, cookies), reduce intake of potatoes, white bread, white pasta, and white rice. Look for whole grain options, oat flour or almond flour.  Each meal should contain half fruits/vegetables, one quarter protein, and one quarter carbs (no bigger than a computer mouse).  Cut down on sweet beverages. This includes juice, soda, and sweet tea. Also watch fruit intake, though this is a healthier sweet option, it still contains natural sugar! Limit to 3 servings daily.  Drink at least 1 glass of water with each meal and aim for at least 8 glasses per day  Exercise at least 150 minutes every week.

## 2021-12-29 NOTE — Progress Notes (Signed)
New Patient Office Visit  Subjective:  Patient ID: Olivia Graham, female    DOB: Jun 25, 1960  Age: 61 y.o. MRN: [id number]  CC:  Chief Complaint  Patient presents with   Establish Care    HPI Olivia Graham presents for new pt  DM-A1C was up to 7.  Working on diet/exercise and <6.5 since.  Did had muscle spasms in back day prior.  2 wks ago, woke up w/R thigh aching.  Took tylenol-no change, so then ibuprofen.  Then moved into R knee.  Fell in 2020 and L wrist/head/knees.  Had muscle spasms then.  Some pain R hip/groin when walks.  Throbbing when sitting in R upper thigh. "Horrible pain" in R knee when it hurts.  Rubbing Fred's alcohol on it and taking ibuprofen helping. Pain to do stairs.  In bank few days ago and "lost balance" from R knee when started walking  Past Medical History:  Diagnosis Date   Diabetes ([place])    Overweight (BMI 25.0-29.9) 11/14/2010   Routine general medical examination at a health care facility 11/14/2010    Past Surgical History:  Procedure Laterality Date   COLONOSCOPY     DILATION AND CURETTAGE OF UTERUS      Family History  Problem Relation Age of Onset   Heart disease Father    Breast cancer Neg Hx     Social History   Socioeconomic History   Marital status: Married    Spouse name: Not on file   Number of children: 2   Years of education: Not on file   Highest education level: Not on file  Occupational History   Not on file  Tobacco Use   Smoking status: Former    Types: Cigarettes    Quit date: 03/06/2009    Years since quitting: 12.8   Smokeless tobacco: Never  Vaping Use   Vaping Use: Never used  Substance and Sexual Activity   Alcohol use: No   Drug use: No   Sexual activity: Yes    Partners: Male    Birth control/protection: Post-menopausal  Other Topics Concern   Not on file  Social History Narrative   2 grands.  [employer]. 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  Worked for [employer] for 31 yrs.     Social Determinants of  Health   Financial Resource Strain: Not on file  Food Insecurity: Not on file  Transportation Needs: No Transportation Needs (03/31/2020)   PRAPARE - Hydrologist (Medical): No    Lack of Transportation (Non-Medical): No  Physical Activity: Not on file  Stress: Not on file  Social Connections: Not on file  Intimate Partner Violence: Not on file    ROS  ROS: Gen: no fever, chills  Skin: no rash, itching ENT: no ear pain, ear drainage, nasal congestion, rhinorrhea, sinus pressure, sore throat Eyes: no blurry vision, double vision Resp: no cough, wheeze,SOB CV: no CP, palpitations, LE edema,  GI: no heartburn, n/v/d/c, abd pain GU: no dysuria, urgency, frequency, hematuria MSK: no joint pain, myalgias, back pain Neuro: no dizziness, weakness, vertigo.  Intermitt HA since fell-had scan etc.  Psych: no depression, anxiety, insomnia, SI   Objective:   Today's Vitals: BP 110/88 (BP Location: Left Arm, Patient Position: Sitting, Cuff Size: Normal)   Pulse 84   Temp 98.3 F (36.8 C) (Oral)   Ht _0  (1.549 m)   Wt 151 lb 3.2 oz (68.6 kg)   LMP 10/08/2014  SpO2 98%   BMI 28.57 kg/m   Physical Exam Musculoskeletal:     Right knee: Crepitus present. No swelling, deformity, effusion, erythema or bony tenderness. Normal range of motion. No tenderness. No LCL laxity, MCL laxity, ACL laxity or PCL laxity.     Instability Tests: Anterior drawer test negative. Posterior drawer test negative. Anterior Lachman test negative. Medial McMurray test negative and lateral McMurray test negative.     Gen: WDWN NAD HEENT: NCAT, conjunctiva not injected, sclera nonicteric NECK:  supple, no thyromegaly, no nodes, no carotid bruits CARDIAC: RRR, S1S2+, no murmur. DP 2+B LUNGS: CTAB. No wheezes ABDOMEN:  BS+, soft, NTND, No HSM, no masses EXT:  no edema MSK: back-no TTP.  MS 5/5 BLE.  No TTP hips/groin.  FROM B hips w/o pain.  SLR neg.  R knee-crunching under  patella and w/movement NEURO: A&O x3.  CN II-XII intact.  PSYCH: normal mood. Good eye contact    Assessment & Plan:   Problem List Items Addressed This Visit   None Visit Diagnoses     Acute pain of right knee    -  Primary   Relevant Orders   DG Knee Complete 4 Views Right      R knee pain-check X-ray.  Cont ibuprofen and "fred's alcohol".  Is improving some.  If worse, no change 2 wks, PT.   F/u for cpx/predm  Outpatient Encounter Medications as of 12/29/2021  Medication Sig   Blood Glucose Monitoring Suppl (CONTOUR NEXT MONITOR) w/Device KIT USE AS DIRECTED   Blood Glucose Monitoring Suppl (CONTOUR NEXT ONE) DEVI 1 Units by Does not apply route daily.   glucose blood (CONTOUR NEXT TEST) test strip Use to check blood sugar twice daily   Microlet Lancets MISC Used to check blood sugar daily   Microlet Lancets MISC USED TO CHECK BLOOD SUGAR DAILY   No facility-administered encounter medications on file as of 12/29/2021.    Follow-up: Return in about 2 months (around 02/28/2022) for annual.   Wellington Hampshire, MD

## 2021-12-30 ENCOUNTER — Telehealth: Payer: Self-pay | Admitting: Family Medicine

## 2021-12-30 NOTE — Telephone Encounter (Signed)
Patient would like a call re scan from 10/27- would like a call back or mychart message.

## 2021-12-30 NOTE — Telephone Encounter (Signed)
Patient informed that pcp hasn't viewed xray results from yesterday, once she does and make recommendations office will give her a call.

## 2022-01-02 ENCOUNTER — Encounter: Payer: Self-pay | Admitting: *Deleted

## 2022-01-02 NOTE — Telephone Encounter (Signed)
Patient stated that if pt will help with the knee, she is willing to do, but she is concerned how PT will affect her back because she has muscle spasms. Patient requested that the doctor call her to discuss, she is used to the doctor calling her to go over things.

## 2022-01-09 NOTE — Addendum Note (Signed)
Addended by: Wellington Hampshire on: 01/09/2022 05:35 PM   Modules accepted: Orders

## 2022-02-02 ENCOUNTER — Ambulatory Visit (INDEPENDENT_AMBULATORY_CARE_PROVIDER_SITE_OTHER): Payer: Commercial Managed Care - HMO | Admitting: Physical Therapy

## 2022-02-02 ENCOUNTER — Encounter: Payer: Self-pay | Admitting: Physical Therapy

## 2022-02-02 DIAGNOSIS — M25561 Pain in right knee: Secondary | ICD-10-CM | POA: Diagnosis not present

## 2022-02-02 NOTE — Therapy (Addendum)
OUTPATIENT PHYSICAL THERAPY LOWER EXTREMITY EVALUATION   Patient Name: Olivia Graham MRN: [id number] DOB:Nov 14, 1960, 61 y.o., female Today's Date: 02/02/2022  END OF SESSION:  PT End of Session - 02/06/22 1538     Visit Number 1    Number of Visits 16    Date for PT Re-Evaluation 03/30/22    Authorization Type [employer]    PT Start Time 0800    PT Stop Time [id number]    PT Time Calculation (min) 43 min    Activity Tolerance Patient tolerated treatment well    Behavior During Therapy [facility] for tasks assessed/performed             Past Medical History:  Diagnosis Date   Diabetes ([place])    Overweight (BMI 25.0-29.9) 11/14/2010   Routine general medical examination at a health care facility 11/14/2010   Past Surgical History:  Procedure Laterality Date   COLONOSCOPY     DILATION AND CURETTAGE OF UTERUS     Patient Active Problem List   Diagnosis Date Noted   Seborrheic keratosis 06/06/2021   Impacted cerumen of right ear 06/06/2021   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  Now this is no longer hurting.  States she is Still " feeling a muscle"  in R thigh.  Works at [employer], lots of walking.   Does have "muscle spasms in back"  in L side of back.  Usually goes away on its own.   PERTINENT HISTORY:   PAIN:  Are you having pain? Yes: NPRS scale: 4/10 Pain location: L side of back Pain description:  tight/spasm Aggravating factors: increased walking, activity  Relieving factors: rest  Are you having pain? Yes: NPRS scale: 4/10 Pain location: R thgh Pain description: sore Aggravating factors: none stated Relieving factors: none stated    PRECAUTIONS: None  WEIGHT BEARING RESTRICTIONS: No  FALLS:  Has patient fallen in last 6 months? No   PLOF: Independent  PATIENT GOALS: improve back muscle spasms, posture, strengthen.    NEXT MD VISIT:   OBJECTIVE:   DIAGNOSTIC FINDINGS:    COGNITION: Overall cognitive status: Within functional limits for tasks assessed     SENSATION: WFL   POSTURE:  Posterior: likely scoliosis:  R LE longer in standing. Slight L lumbar shift , L shoulder lower Will re-check leg length next visit in supine   PALPATION: Left Lumbar significantly tighter than R. No tenderness in R quad with palpation today   R quad with increased tightness and soreness with prone stretch vs L.    LOWER EXTREMITY ROM:  Lumbar: flexion: WFL, ext: mild limitation, L SB: WFL, R SB: mod limitation Hips: WFL  LOWER EXTREMITY MMT:  MMT Right eval Left eval  Hip flexion 4 5  Hip extension    Hip abduction 4 5  Hip adduction    Hip internal rotation    Hip external rotation    Knee flexion  5 5  Knee extension 5 5  Ankle dorsiflexion    Ankle plantarflexion    Ankle inversion    Ankle eversion     (Blank rows = not tested)  LOWER EXTREMITY SPECIAL TESTS:    FUNCTIONAL TESTS:    GAIT:    TODAY'S TREATMENT:                                                                                                                              DATE:    02/02/22: Ther ex: see below for HEP  PATIENT EDUCATION:  Education details: PT POC, Exam findings, HEP Person educated: Patient Education method: Explanation, Demonstration, Tactile cues, Verbal cues, and Handouts Education comprehension: verbalized understanding, returned demonstration, verbal cues  required, tactile cues required, and needs further education   HOME EXERCISE PROGRAM: Access Code: [email or web address] URL: [email or web address] Date: 02/06/2022 Prepared by: Lyndee Hensen  Exercises - Prone Quadriceps Stretch with Strap  - 1-2 x daily - 3 reps - 30 hold - Cat Cow  - 1-2 x daily - 1-2 sets - 10 reps - Supine Lower Trunk Rotation  - 2 x daily - 10 reps - 5 hold - Hooklying Single Knee to Chest Stretch  - 2 x daily - 3 reps - 30 hold - Supine Piriformis Stretch Pulling Heel to Hip  - 2 x daily - 3 reps - 30 hold - Seated Piriformis Stretch with Trunk Bend  - 2 x daily - 3 reps - 30 hold   ASSESSMENT:  CLINICAL IMPRESSION: Patient presents with primary complaint of increased pain in R thigh that has improved quite a bit already. She does have mild muscle tension here with testing, as well as soreness with increased activity. She also has increased/ongoing muscle spasms in L side of back. Found to have postural asymmetry that may be likely cause. She has decreased tolerance for sustaining standing/walking due to variable pain and spasm in back. Pt to benefit from education on HEP for management of this. Pt to benefit from skilled PT to improve deficits and pain.    OBJECTIVE IMPAIRMENTS: decreased activity tolerance, decreased mobility, decreased ROM, decreased strength, increased muscle spasms, impaired flexibility, improper body mechanics, postural dysfunction, and pain.   ACTIVITY LIMITATIONS: lifting, bending, standing, and locomotion level  PARTICIPATION LIMITATIONS: cleaning, shopping, community activity, and occupation  PERSONAL FACTORS:  posture/scoliosis  are also affecting patient's functional outcome.   REHAB POTENTIAL: Good  CLINICAL DECISION MAKING: Stable/uncomplicated  EVALUATION COMPLEXITY: Low    GOALS: Goals reviewed with patient? Yes  SHORT TERM GOALS: Target date: 02/16/2022   Pt to be independent with final HEP  Goal status:  INITIAL     LONG TERM GOALS: Target date: 03/30/2022   Pt to be independent with final HEP  Goal status: INITIAL  2.  Pt to report decreased pain in R LE/thigh to be 0-2/10 with activity and work duties.   Goal status: INITIAL  3.  Pt  to report decreased tightness and pain in L low back to be 0-2/10 with activity and standing/walking for at least 30 min;   Goal status: INITIAL   PLAN:  PT FREQUENCY: 1-2x/week  PT DURATION: 8 weeks  PLANNED INTERVENTIONS: Therapeutic exercises, Therapeutic activity, Neuromuscular re-education, Balance training, Gait training, Patient/Family education, Self Care, Joint mobilization, Joint manipulation, Stair training, Orthotic/Fit training, DME instructions, Aquatic Therapy, Dry Needling, Electrical stimulation, Spinal manipulation, Spinal mobilization, Cryotherapy, Moist heat, Taping, Vasopneumatic device, Traction, Ultrasound, Ionotophoresis '4mg'$ /ml Dexamethasone, and Manual therapy  PLAN FOR NEXT SESSION: LAQ, SLR,quad strength,   Look at leg length, teach L QL stretches and side glides,    Lyndee Hensen, PT, DPT 3:39 PM  02/06/22   PHYSICAL THERAPY DISCHARGE SUMMARY  Visits from Start of Care: 1 Plan: Patient agrees to discharge.  Patient goals were not met. Patient is being discharged due to meeting  - Pt did not return after last visit.      Lyndee Hensen, PT, DPT 2:13 PM  05/04/22

## 2022-02-06 ENCOUNTER — Encounter: Payer: Self-pay | Admitting: Physical Therapy

## 2022-02-09 ENCOUNTER — Encounter: Payer: Commercial Managed Care - HMO | Admitting: Physical Therapy

## 2022-02-16 ENCOUNTER — Encounter: Payer: Commercial Managed Care - HMO | Admitting: Physical Therapy

## 2022-03-02 ENCOUNTER — Encounter: Payer: Commercial Managed Care - HMO | Admitting: Family Medicine

## 2022-10-06 IMAGING — MG MM DIGITAL SCREENING BILAT W/ TOMO AND CAD
8 series · 8 of 24 positions shown · non-contrast
Comparison: Previous exam(s).

CLINICAL DATA: Screening.

EXAM:
DIGITAL SCREENING BILATERAL MAMMOGRAM WITH TOMOSYNTHESIS AND CAD
TECHNIQUE: Bilateral screening digital craniocaudal and mediolateral oblique
mammograms were obtained. Bilateral screening digital breast
tomosynthesis was performed. The images were evaluated with
computer-aided detection.

[L CC synth-2D]
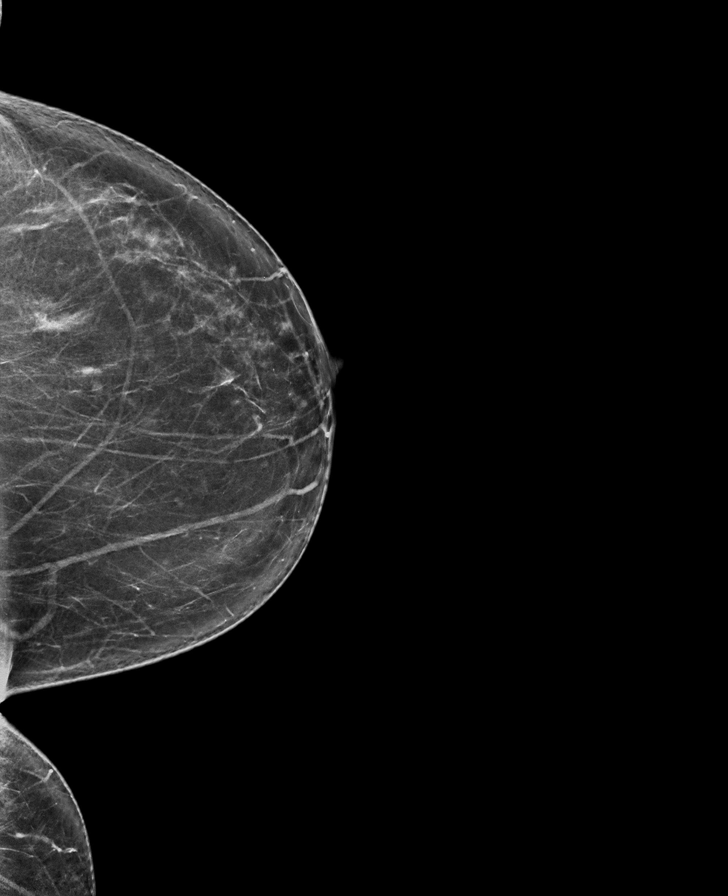

[R CC synth-2D]
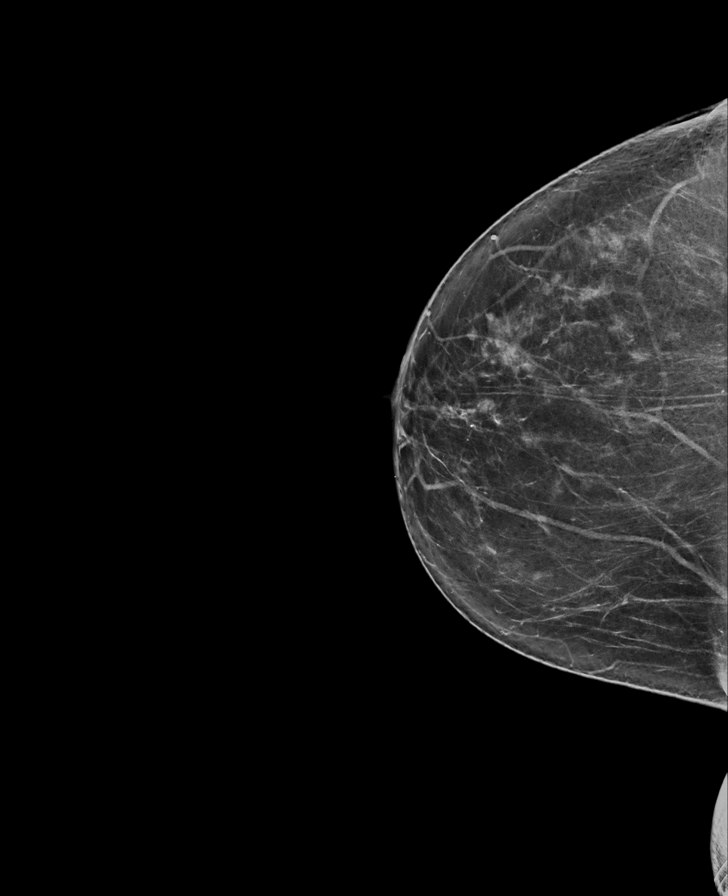

[R MLO synth-2D]
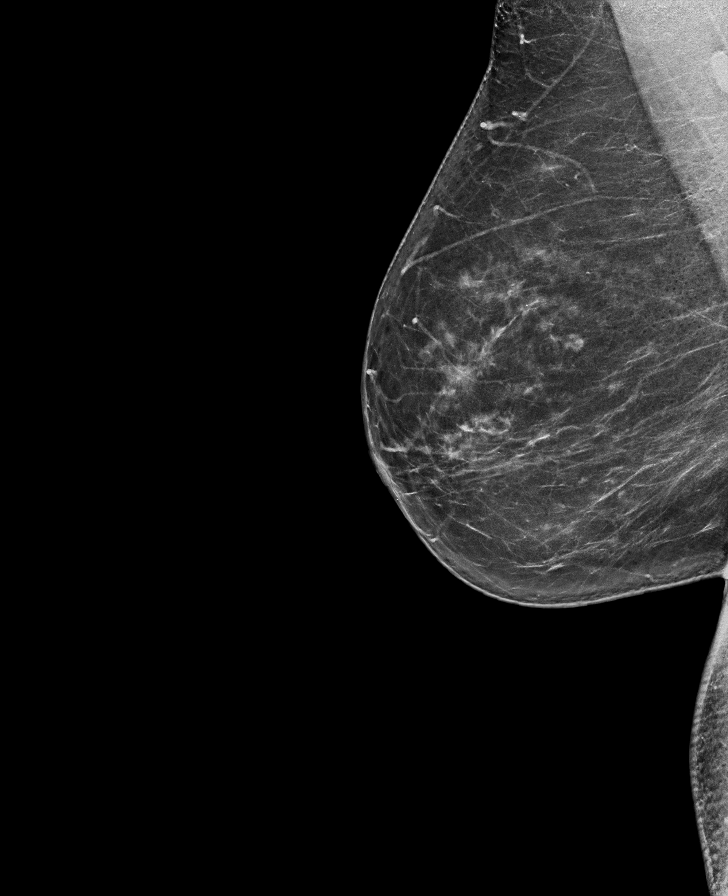

[L MLO synth-2D]
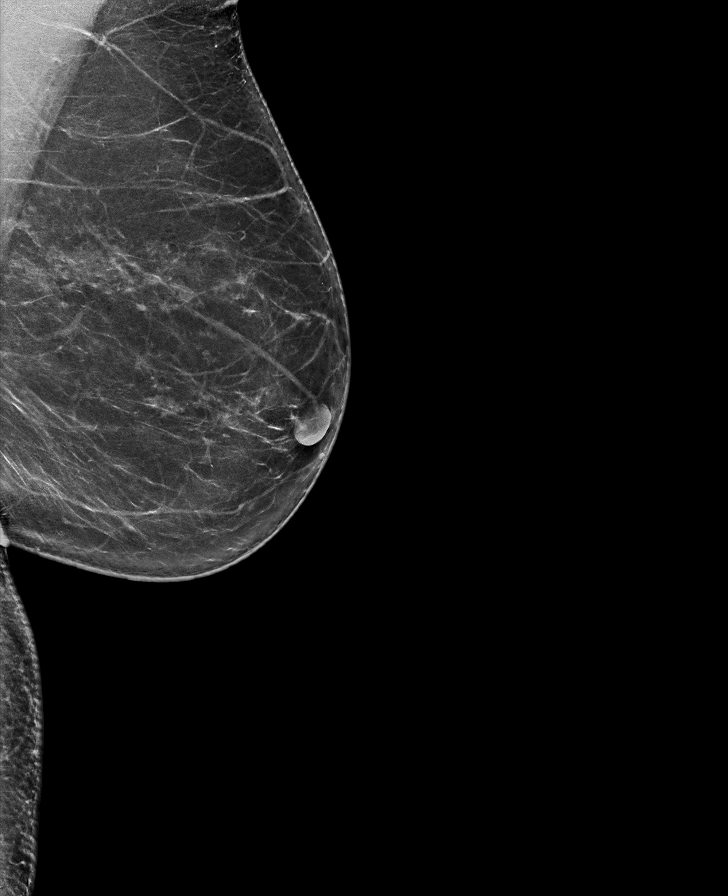

[R CC tomo · tomo slice 35/69.0]
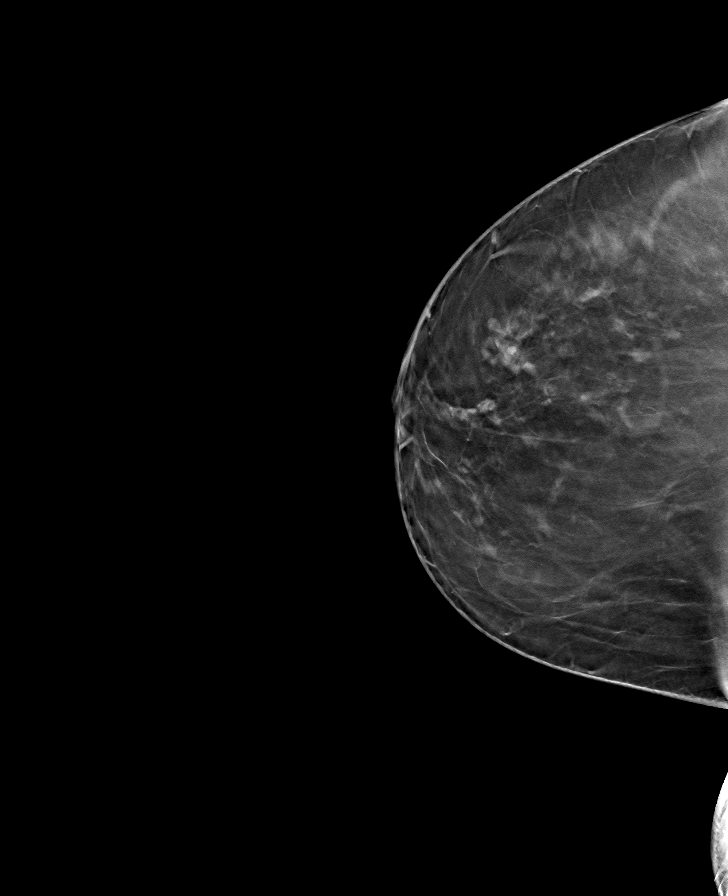

[R MLO tomo · tomo slice 37/74.0]
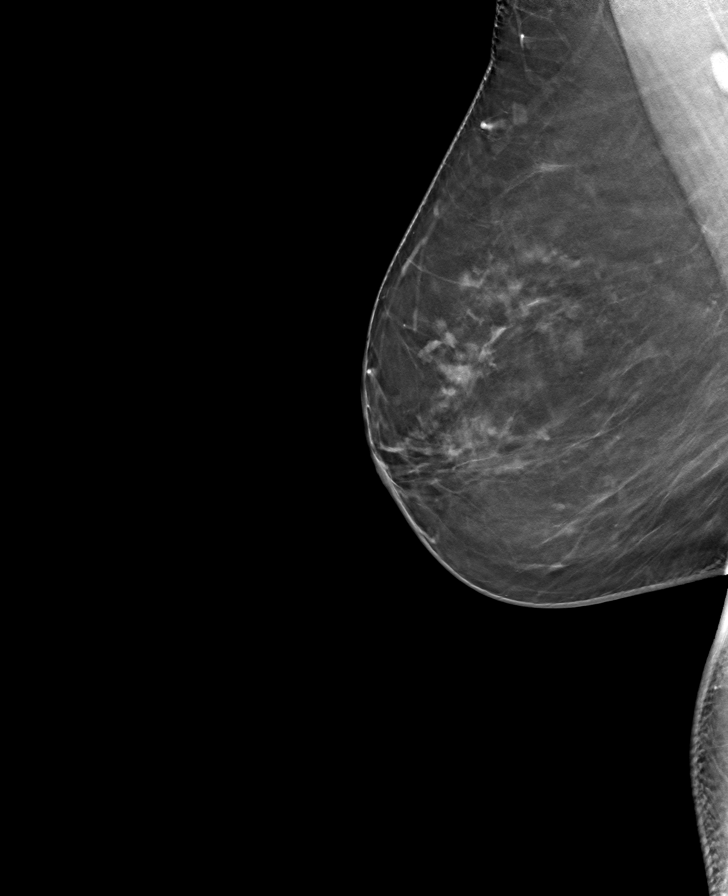

[L CC tomo · tomo slice 36/71.0]
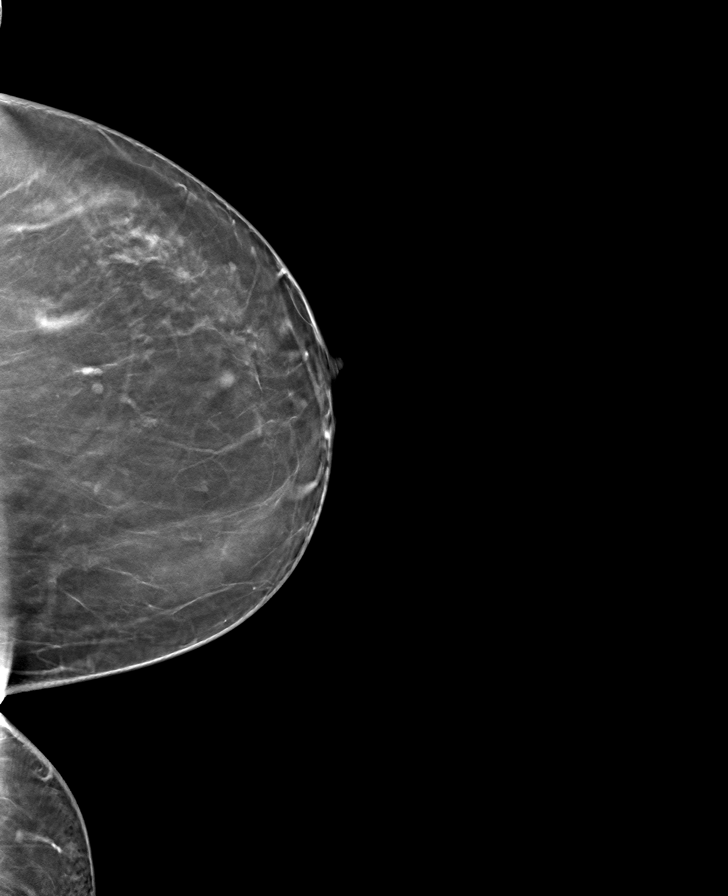

[L MLO tomo · tomo slice 38/75.0]
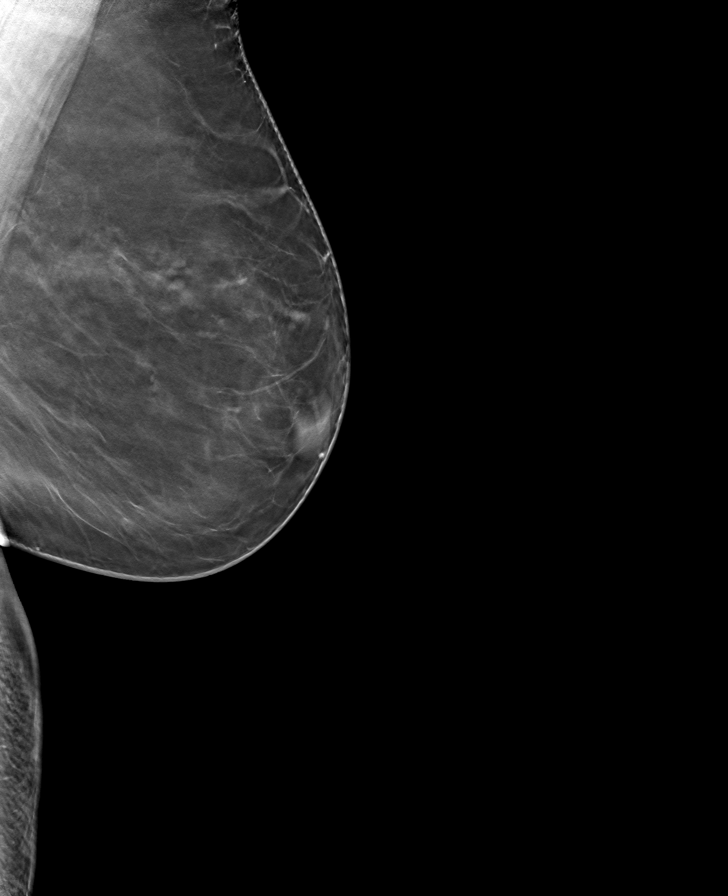

[8 of 24 positions shown; findings below may reference images not displayed]

ACR Breast Density Category b: There are scattered areas of
fibroglandular density.
FINDINGS: There are no findings suspicious for malignancy.
IMPRESSION: No mammographic evidence of malignancy. A result letter of this
screening mammogram will be mailed directly to the patient.

RECOMMENDATION:
Screening mammogram in one year. (Code:51-O-LD2)

BI-RADS CATEGORY  1: Negative.

## 2023-09-17 ENCOUNTER — Other Ambulatory Visit: Payer: Self-pay

## 2023-09-17 ENCOUNTER — Emergency Department (HOSPITAL_BASED_OUTPATIENT_CLINIC_OR_DEPARTMENT_OTHER): Admission: EM | Admit: 2023-09-17 | Discharge: 2023-09-17 | Disposition: A | Discharge status: Home / Self Care

## 2023-09-17 ENCOUNTER — Encounter (HOSPITAL_BASED_OUTPATIENT_CLINIC_OR_DEPARTMENT_OTHER): Payer: Self-pay

## 2023-09-17 DIAGNOSIS — M542 Cervicalgia: Secondary | ICD-10-CM | POA: Diagnosis not present

## 2023-09-17 DIAGNOSIS — M545 Low back pain, unspecified: Secondary | ICD-10-CM | POA: Diagnosis not present

## 2023-09-17 DIAGNOSIS — Y92410 Unspecified street and highway as the place of occurrence of the external cause: Secondary | ICD-10-CM | POA: Diagnosis not present

## 2023-09-17 DIAGNOSIS — E119 Type 2 diabetes mellitus without complications: Secondary | ICD-10-CM | POA: Diagnosis not present

## 2023-09-17 DIAGNOSIS — M546 Pain in thoracic spine: Secondary | ICD-10-CM | POA: Insufficient documentation

## 2023-09-17 DIAGNOSIS — I10 Essential (primary) hypertension: Secondary | ICD-10-CM | POA: Insufficient documentation

## 2023-09-17 DIAGNOSIS — M62838 Other muscle spasm: Secondary | ICD-10-CM | POA: Diagnosis present

## 2023-09-17 MED ORDER — CYCLOBENZAPRINE HCL 10 MG PO TABS: 10.0000 mg | ORAL_TABLET | Freq: Two times a day (BID) | ORAL | 0 refills | Status: AC | PRN

## 2023-09-17 MED ORDER — LIDOCAINE 5 % EX PTCH: 1.0000 | MEDICATED_PATCH | CUTANEOUS | 0 refills | Status: AC
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
## 2023-09-17 NOTE — Discharge Instructions (Signed)
 As discussed, we will send in muscle relaxer as well as numbing patch to use for your symptoms.  Muscle action can cause drowsiness so please do not drive or perform any others contributing to its effects on you.  You may continue to take ibuprofen with these medications.  Recommend follow-up with your primary care for reassessment.  Please do not hesitate to return to emergency department if the worrisome signs and symptoms we discussed become apparent.

## 2023-09-21 ENCOUNTER — Telehealth: Payer: Self-pay

## 2023-09-21 NOTE — Telephone Encounter (Unsigned)
 Copied from CRM [id number]. Topic: General - Other >> Sep 21, 2023 12:03 PM Chiquita SQUIBB wrote: Reason for CRM: Patient is calling in due to a missed call about a hospital follow patient stated she is no longer seeing Dr. Wendolyn that she has a new PCP.

## 2023-09-21 NOTE — Telephone Encounter (Signed)
 Transition Care Management Unsuccessful Follow-up Telephone Call  Date of discharge and from where:  09/17/23 [facility]  Attempts:  1st Attempt  Reason for unsuccessful TCM follow-up call:  Left voice message

## 2023-09-27 ENCOUNTER — Emergency Department (HOSPITAL_BASED_OUTPATIENT_CLINIC_OR_DEPARTMENT_OTHER)

## 2023-09-27 ENCOUNTER — Encounter (HOSPITAL_BASED_OUTPATIENT_CLINIC_OR_DEPARTMENT_OTHER): Payer: Self-pay

## 2023-09-27 ENCOUNTER — Other Ambulatory Visit: Payer: Self-pay

## 2023-09-27 ENCOUNTER — Emergency Department (HOSPITAL_BASED_OUTPATIENT_CLINIC_OR_DEPARTMENT_OTHER): Admission: EM | Admit: 2023-09-27 | Discharge: 2023-09-27 | Disposition: A | Discharge status: Home / Self Care

## 2023-09-27 DIAGNOSIS — M549 Dorsalgia, unspecified: Secondary | ICD-10-CM | POA: Insufficient documentation

## 2023-09-27 DIAGNOSIS — K8020 Calculus of gallbladder without cholecystitis without obstruction: Secondary | ICD-10-CM | POA: Diagnosis not present

## 2023-09-27 MED ORDER — LIDOCAINE 5 % EX PTCH
2.0000 | MEDICATED_PATCH | CUTANEOUS | Status: DC
  Administered 2023-09-27: 2 via TRANSDERMAL
  Filled 2023-09-27: qty 2

## 2023-09-27 NOTE — Discharge Instructions (Addendum)
 Follow up with orthopedics, call to schedule an appointment. Follow up with your primary care provider to review today's finding of gallstones.
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
## 2023-09-27 NOTE — ED Notes (Signed)
 Pt d/c instructions, medications, and follow-up care reviewed with pt. Pt verbalized understanding and had no further questions at time of d/c. Pt CA&Ox4, ambulatory, and in NAD at time of d/c
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
